# Patient Record
Sex: Female | Born: 1940 | Race: White | Hispanic: No | Smoking: Former smoker
Health system: Southern US, Community
[De-identification: ages and names within clinical notes are randomized; demographics above are authoritative.]

## PROBLEM LIST (undated history)

## (undated) DIAGNOSIS — C801 Malignant (primary) neoplasm, unspecified: Secondary | ICD-10-CM

## (undated) DIAGNOSIS — Z86010 Personal history of colonic polyps: Secondary | ICD-10-CM

## (undated) DIAGNOSIS — Z860101 Personal history of adenomatous and serrated colon polyps: Secondary | ICD-10-CM

## (undated) DIAGNOSIS — I10 Essential (primary) hypertension: Secondary | ICD-10-CM

## (undated) DIAGNOSIS — R519 Headache, unspecified: Secondary | ICD-10-CM

## (undated) DIAGNOSIS — E7800 Pure hypercholesterolemia, unspecified: Secondary | ICD-10-CM

## (undated) DIAGNOSIS — J302 Other seasonal allergic rhinitis: Secondary | ICD-10-CM

## (undated) DIAGNOSIS — E119 Type 2 diabetes mellitus without complications: Secondary | ICD-10-CM

## (undated) DIAGNOSIS — K219 Gastro-esophageal reflux disease without esophagitis: Secondary | ICD-10-CM

## (undated) DIAGNOSIS — M109 Gout, unspecified: Secondary | ICD-10-CM

## (undated) HISTORY — PX: ABDOMINAL HYSTERECTOMY: SHX81

## (undated) HISTORY — DX: Gout, unspecified: M10.9

## (undated) HISTORY — DX: Personal history of adenomatous and serrated colon polyps: Z86.0101

## (undated) HISTORY — PX: PARATHYROIDECTOMY: SHX19

## (undated) HISTORY — DX: Essential (primary) hypertension: I10

## (undated) HISTORY — DX: Personal history of colonic polyps: Z86.010

## (undated) HISTORY — DX: Type 2 diabetes mellitus without complications: E11.9

---

## 2000-11-06 ENCOUNTER — Ambulatory Visit (HOSPITAL_COMMUNITY): Admission: RE | Admit: 2000-11-06 | Discharge: 2000-11-06 | Payer: Self-pay | Admitting: Internal Medicine

## 2007-10-14 ENCOUNTER — Ambulatory Visit: Payer: Self-pay | Admitting: Gastroenterology

## 2007-10-25 ENCOUNTER — Ambulatory Visit (HOSPITAL_COMMUNITY): Admission: RE | Admit: 2007-10-25 | Discharge: 2007-10-25 | Payer: Self-pay | Admitting: Internal Medicine

## 2007-10-25 ENCOUNTER — Encounter: Payer: Self-pay | Admitting: Internal Medicine

## 2007-10-25 ENCOUNTER — Ambulatory Visit: Payer: Self-pay | Admitting: Internal Medicine

## 2007-10-25 HISTORY — PX: COLONOSCOPY: SHX174

## 2007-10-25 HISTORY — PX: ESOPHAGOGASTRODUODENOSCOPY: SHX1529

## 2007-12-03 ENCOUNTER — Ambulatory Visit: Payer: Self-pay | Admitting: Internal Medicine

## 2008-01-20 ENCOUNTER — Ambulatory Visit: Payer: Self-pay | Admitting: Internal Medicine

## 2009-02-12 ENCOUNTER — Encounter: Payer: Self-pay | Admitting: Urgent Care

## 2009-03-09 ENCOUNTER — Ambulatory Visit: Payer: Self-pay | Admitting: Internal Medicine

## 2009-03-09 DIAGNOSIS — K219 Gastro-esophageal reflux disease without esophagitis: Secondary | ICD-10-CM

## 2009-03-09 DIAGNOSIS — K5730 Diverticulosis of large intestine without perforation or abscess without bleeding: Secondary | ICD-10-CM | POA: Insufficient documentation

## 2009-03-09 DIAGNOSIS — K449 Diaphragmatic hernia without obstruction or gangrene: Secondary | ICD-10-CM | POA: Insufficient documentation

## 2009-03-09 DIAGNOSIS — J301 Allergic rhinitis due to pollen: Secondary | ICD-10-CM

## 2009-03-09 DIAGNOSIS — Z86010 Personal history of colonic polyps: Secondary | ICD-10-CM | POA: Insufficient documentation

## 2009-04-03 ENCOUNTER — Encounter: Payer: Self-pay | Admitting: Gastroenterology

## 2009-09-25 ENCOUNTER — Encounter: Payer: Self-pay | Admitting: Urgent Care

## 2009-09-26 ENCOUNTER — Encounter: Payer: Self-pay | Admitting: Internal Medicine

## 2009-09-27 ENCOUNTER — Encounter: Payer: Self-pay | Admitting: Gastroenterology

## 2010-03-19 ENCOUNTER — Encounter (INDEPENDENT_AMBULATORY_CARE_PROVIDER_SITE_OTHER): Payer: Self-pay | Admitting: *Deleted

## 2010-05-07 NOTE — Medication Information (Signed)
Summary: Tax adviser   Imported By: Rosine Beat 09/26/2009 09:49:30  _____________________________________________________________________  External Attachment:    Type:   Image     Comment:   External Document

## 2010-05-07 NOTE — Medication Information (Signed)
Summary: Tax adviser   Imported By: Diana Eves 09/27/2009 11:44:33  _____________________________________________________________________  External Attachment:    Type:   Image     Comment:   External Document  Appended Document: RX Folder KJ did this two days ago.  Appended Document: RX Folder pharmacy aware. they did already have it.

## 2010-05-07 NOTE — Medication Information (Signed)
Summary: RX Folder  RX Folder   Imported By: Peggyann Shoals 09/25/2009 10:53:18  _____________________________________________________________________  External Attachment:    Type:   Image     Comment:   External Document  Appended Document: RX FolderOMEPRAZOLE    Prescriptions: OMEPRAZOLE 20 MG CPDR (OMEPRAZOLE) 1 by mouth daily for acid reflux  #31 x 11   Entered and Authorized by:   Joselyn Arrow FNP-BC   Signed by:   Joselyn Arrow FNP-BC on 09/25/2009   Method used:   Electronically to        [facility]* (retail)       [address]       [place], [place]  [address]       Ph: [phone]       Fax: [phone]   RxID:   [id number]

## 2010-05-09 NOTE — Letter (Signed)
Summary: Recall Office Visit  [facility]  [address]   [place], [place] [address]   Phone: [phone]  Fax: [phone]      March 19, 2010   Michele Pace [address] [place], [place]  [address] 02-May-1940   Dear Ms. Gautier,   According to our records, it is time for you to schedule a follow-up office visit with [place].   At your convenience, please call [phone] to schedule an office visit. If you have any questions, concerns, or feel that this letter is in error, we would appreciate your call.   Sincerely,    Diana Eves  [facility] Ph: [phone]   Fax: [phone]

## 2010-08-20 NOTE — Assessment & Plan Note (Signed)
Michele Pace, Michele Pace                    CHART#:  [id number]   DATE:  01/20/2008                       DOB:  02/24/1941   The patient was last seen here on December 03, 2007, with history of  gastroesophageal reflux, diverticulosis, prior history of colonic  polyps, and gas bloat symptoms.  She failed to improve significantly on  Prevacid and omeprazole.  We started on some Aciphex 20 mg orally daily.  She is also started on Digestive Advantage for gas bloat 1 capsule  daily.  She is here stating she 100% better and her quality of life is  much better, gas is diminished and reflux symptoms well are  controlled  on Aciphex 20 mg orally daily.  She is very happy wondering if she  should take a couple of FiberChoice tablets daily.   CURRENT MEDICATIONS:  See updated list.   ALLERGIES:  Codeine.   PHYSICAL EXAMINATION:  GENERAL:  Today, she looks pretty well.  VITAL SIGNS:  Weight 176, height 5 feet 5 inches, temperature 99, BP  110/82, and pulse 76.  SKIN:  Warm and dry.  ABDOMEN:  Obese, positive bowel sounds, soft, entirely nontender.  No  appreciable mass or organomegaly.   ASSESSMENT:  Gastroesophageal reflux disease symptoms well controlled on  Aciphex, gas bloat symptoms well managed on Digestive Advantage, history  of diverticulosis, and prior history of Colonic polyps.   RECOMMENDATIONS:  1. Continue Aciphex 20 mg orally daily.  Continue Digestive Advantage      one capsule daily.  2. She was told it would be fine to take a fiber supplement daily as      well.  Unless something comes up, I will plan to see this nice lady      back in 1 year for a recheck.  She will be slated for surveillance      colonoscopy in 5 years.       Jonathon Bellows, M.D.  Electronically Signed    RMR/MEDQ  D:  01/20/2008  T:  01/21/2008  Job:  [id number]   cc:   Dr. Sherryll Burger

## 2010-08-20 NOTE — Op Note (Signed)
NAME:  Michele Pace, Michele Pace NO.:  [id number]   MEDICAL RECORD NO.:  [id number]          PATIENT TYPE:  AMB   LOCATION:  DAY                           FACILITY:  [facility]   PHYSICIAN:  R. Roetta Sessions, M.D. DATE OF BIRTH:  February 14, 1941   DATE OF PROCEDURE:  DATE OF DISCHARGE:                               OPERATIVE REPORT   EGD WITH BIOPSY FOLLOWED BY COLONOSCOPY   INDICATIONS FOR PROCEDURE:  A 70 year old lady with a history of colonic  polyps.  Last colonoscopy some 7 years ago.  He has had apparent  refractory chronic gastroesophageal reflux to Prevacid 30 mg orally  daily.  Dr. Herbie Drape recommended she bump up to her Prevacid to b.i.d., but  she has not yet done so.  We saw her in the office on October 14, 2007.  EGD  and colonoscopy now being done.  This approach has been discussed with  the patient at length.  Potential risks, benefits, and alternatives have  been reviewed, questions answered.  Please see the documentation in the  medical record.   PROCEDURE NOTE:  O2 saturation, blood pressure, pulse, and respirations  monitored throughout the entire procedure.   CONSCIOUS SEDATION:  Versed 6 mg IV and Demerol 75 mg IV in divided  doses.  Cetacaine spray for topical pharyngeal anesthesia.   INSTRUMENT:  Pentax video chip system.   FINDINGS:  EGD examination of the tubular esophagus revealed patulous EG  junction, otherwise esophageal mucosa appeared normal.  EG junction  easily traversed.   STOMACH:  Gas cavity was emptied, insufflated well with air.  Thorough  examination of the gastric mucosa including retroflexed view of the  proximal stomach esophagogastric junction demonstrated only a small  hiatal hernia and some focal antral prepyloric erosions.  There was no  ulcer infiltrating process.  Pylorus was patent, easily traversed.  Examination of the bulb revealed bulbar erosions, otherwise D1 and D2  appeared normal.   THERAPEUTIC/DIAGNOSTIC MANEUVERS PERFORMED:   Biopsies of the antral  mucosa were taken for histologic study.  The patient tolerated the  procedure well and was prepared for colonoscopy.  Digital rectal exam  revealed no abnormalities.   ENDOSCOPIC FINDINGS:  The prep was adequate.   COLON:  Colonic mucosa was surveyed from the rectosigmoid junction  through the left transverse, right colon, to the appendiceal orifice,  ileocecal valve, and cecum.  These structures were well seen,  photographed for the record.  From this level, scope was slowly  withdrawn.  All previous mucosal surfaces were again seen.  The patient  had left-sided diverticula.  The remainder of colonic mucosa appeared  normal.  Scope was pulled down the record.  A thorough examination of  the rectal mucosa including retroflexion of the anal verge demonstrated  no abnormalities.  The patient tolerated both procedures well, was  reactive to endoscopy.   IMPRESSION:  Patulous esophagogastric junction focal antral erosions,  otherwise normal-appearing gastric mucosa, patent pylorus, bulbar  erosions, otherwise D1 and D2 appeared normal.   COLONOSCOPY FINDINGS:  Normal rectum, left-sided diverticula, and  colonic  mucosa appeared normal.   RECOMMENDATIONS:  1. Gastroesophageal flux disease/diverticulosis.  Literature provided      to Ms. Gearldine Bienenstock.  Agreed with Dr. Rico Ala plan to go and increase      Prevacid to 30 mg orally twice daily for the next month.  2. Plan for repeat colonoscopy in 5 years.  3. Follow up with [place] in 1 month.      Jonathon Bellows, M.D.  Electronically Signed     RMR/MEDQ  D:  10/25/2007  T:  10/26/2007  Job:  [id number]   cc:   Dr. Herbie Drape

## 2010-08-20 NOTE — Assessment & Plan Note (Signed)
NAMEBABITA, AMAKER                    CHART#:  [id number]   DATE:  12/03/2007                       DOB:  08-21-1940   Last seen on 10/25/2007 at which time she underwent both an EGD and a  colonoscopy for refractory gastroesophageal reflux disease, positive  history of polyps.  EGD demonstrated some focal antral erosions,  patulous EG junction with some bulbar erosions as well.  Colonoscopy  demonstrated left-sided diverticula.  Remainder of the rectum and colon  appeared normal.  We had been under the assumption she was on Prevacid  30 mg orally daily, which is bumped up to b.i.d. on 10/25/2007, but she  actually had been on omeprazole 20 mg orally daily, and this was bumped  up.  This has only been of minimal success with ameliorating her  symptoms.  She does have quite a bit of postprandial abdominal bloating  and really does not have any frank abdominal pain.  She denies  constipation, diarrhea, melena, or hematochezia.  She does take Beano on  occasion.   CURRENT MEDICATIONS:  See updated list.   ALLERGIES:  Codeine.   PHYSICAL EXAMINATION:  GENERAL:  She appears her baseline.  VITAL SIGNS:  Weight 178, height 5 foot 5-1/2 inches, temperature 98, BP  120/84, pulse 70.  SKIN:  Warm and dry.  ABDOMEN:  Flat, positive bowel sounds, soft, entirely nontender to deep  palpation.  No appreciable mass or organomegaly.   ASSESSMENT:  1. History of antral and bulbar erosions on recent      esophagogastroduodenoscopy, patulous esophagogastric junction,      suspect a significant element of gastroesophageal reflux disease.  2. History of diverticulosis, otherwise negative colonoscopy, recent      history of polyps.  She has significant gas bloat symptoms.   RECOMMENDATIONS:  1. Stop omeprazole for time being.  Begin Aciphex 20 mg orally daily      30 minutes before breakfast.  Samples for 1 month given.  2. Begin Digestive Advantage for gas bloat 1 capsule daily.  Samples      given  for the next      month.  We will how she does over the next 4 weeks.  I plan to see      her back in 1 month to assess her progress.       Jonathon Bellows, M.D.  Electronically Signed     RMR/MEDQ  D:  12/03/2007  T:  12/04/2007  Job:  [id number]   cc:   Kirstie Peri, MD

## 2010-08-20 NOTE — Consult Note (Signed)
NAMECLARINE, ELROD NO.:  [id number]   MEDICAL RECORD NO.:  [id number]          PATIENT TYPE:  AMB   LOCATION:  DAY                           FACILITY:  [facility]   PHYSICIAN:  Kassie Mends, M.D.      DATE OF BIRTH:  05-15-1940   DATE OF CONSULTATION:  10/14/2007  DATE OF DISCHARGE:                                 CONSULTATION   REQUESTING PHYSICIAN:  Kirstie Peri, MD   PRIMARY GASTROENTEROLOGIST:  Jonathon Bellows, MD   REASON FOR CONSULTATION:  Due for colonoscopy, history of polyps, and  refractory GERD.   HISTORY OF PRESENT ILLNESS:  Ms. Delahoz is a 69 year old Caucasian female.  She returns as she tells me it is time to have another colonoscopy.  Upon further review of her records, she did have a colonoscopy with  snare polypectomy on November 06, 2000, by Dr. Jena Gauss.  She was found to  have internal hemorrhoids, a polyp on a stalk at 35 cm, which was  cauterized and on biopsy, it was identifiable only as colonic mucosa.  Therefore, there was no determination as to whether this was adenomatous  or hyperplastic.  She does have a family history of a paternal aunt with  colon cancer and therefore has been recommended by Dr. Jena Gauss that she  pursue a colonoscopy in 5 years, she is 2 years overdue.  She has a  history of IV as she occasionally has loose stools.  Denies any rectal  bleeding or melena.  Denies any abdominal pain except for intermittent  heartburn.  She is having breakthrough symptoms a couple of times per  week.  She has been on omeprazole 20 mg daily and was just recently  changed to Prevacid 30 mg b.i.d.  Her weight is steadily increasing.  She denies any anorexia.  She has had chronic GERD for over 10 years.  Denies any dysphagia, odynophagia, nausea, or vomiting.   PAST MEDICAL AND SURGICAL HISTORY:  1. Hypertension.  2. Chronic GERD.  3. Seasonal allergies.  4. Colonic polyps.  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   CURRENT MEDICATIONS:  1. Omeprazole 20 mg daily, will be changed tomorrow to Prevacid 30 mg      b.i.d.  2. Lovaza 1 g daily.  3. Amlodipine 5/20 mg daily.  4. Estradiol 1 mg q.3 days.  5. Chlorthalidone 12.5 mg daily.  6. Zyrtec 10 mg daily.  7. Vitamin D 1000 international units daily.  8. Vitamin D3 1000 international units daily.  9. Aspirin 325 mg p.r.n.  10.Gas-X p.r.n.   ALLERGIES:  CODEINE.   FAMILY HISTORY:  Positive for paternal aunt with colon cancer.  No first-  degree relatives.  Mother deceased at 29, secondary to osteoarthritis.  Father deceased at 14, secondary to CVA.  She has a sister with possible  degenerative problems and then both siblings have a heart valve  problem/murmurs.   SOCIAL HISTORY:  Ms. Hugh is a widow.  She is a Environmental manager.  She smokes about 3 packs of cigarettes a week and has for the last 25  years.  She rarely consumes alcoholic beverages.  Denies any drug use.   REVIEW OF SYSTEMS:  See HPI, otherwise negative.   PHYSICAL EXAMINATION:  VITAL SIGNS:  Weight 172 pounds, height 65-1/2  inches, temperature 97.7, blood pressure 100/80, and pulse 80.  GENERAL:  Ms. Spadafora is a 70 year old, well-developed, well-nourished  Caucasian female, in no acute distress.  HEENT:  Sclerae clear, nonicteric.  Conjunctivae pink.  Oropharynx pink  and moist without any lesions.  NECK:  Supple without mass or thyromegaly.  CHEST:  Heart, regular rate and rhythm.  Normal S1 and S2.  No murmurs,  clicks, rubs, or gallops.  LUNGS:  Clear auscultation bilaterally.  ABDOMEN:  Positive bowel sounds x4.  No bruits auscultated.  Soft,  nontender, and nondistended without palpable mass or hepatosplenomegaly.  No rebound tenderness or guarding.  EXTREMITIES:  Without clubbing or edema.  SKIN:  Pink, warm, and dry without any rash or jaundice.   IMPRESSION:  Ms. Contino is a 70 year old Caucasian  female, who is due for  colonoscopy given history of colonic polyps.  Last colonoscopy polyp was  unable to be identified.  It was on a stalk and therefore, she is due  for surveillance at this time.   She has a history of chronic gastroesophageal reflux disease with  breakthrough heartburn, on proton pump inhibitor.  She is going to  require further evaluation to rule out complicated gastroesophageal  reflux disease including erosive reflux esophagitis and/or Barrett  esophagus.   PLAN:  Colonoscopy and EGD with Dr. Jena Gauss in the near future.  I  discussed the procedure including risks and benefits to include, but not  limited to bleeding, infection, perforation, or drug reaction.  She  agrees with the plan and consent will be obtained.   Thank you Dr. Sherryll Burger for allowing [place] to participate in the care of Ms.  Lacaze.      Lorenza Burton, N.P.      Kassie Mends, M.D.  Electronically Signed    KJ/MEDQ  D:  10/14/2007  T:  10/15/2007  Job:  [id number]   cc:   Kirstie Peri, MD  Fax: [phone]

## 2012-01-14 ENCOUNTER — Telehealth: Payer: Self-pay

## 2012-01-14 NOTE — Telephone Encounter (Signed)
Returned her call and LMOM to call. ( She had an EGD/TCS in 10/2007 and Dr. Jena Gauss recommended her next one in 5 years which would be 10/2012. She has a history of colonic polyps.

## 2012-01-14 NOTE — Telephone Encounter (Signed)
Pt returned call. Said she is not having any problems. Dr. Sherryll Burger had thought her last one was in 2002. I reminded her her last one was in 10/2007 and Dr. Jena Gauss recommended her next one should be in 10/2012. She will call if she has problems before then. I am sending Dr. Sherryll Burger the info.

## 2014-03-08 ENCOUNTER — Ambulatory Visit: Payer: Self-pay | Admitting: Gastroenterology

## 2014-03-09 ENCOUNTER — Ambulatory Visit: Payer: Self-pay | Admitting: Gastroenterology

## 2014-03-29 ENCOUNTER — Ambulatory Visit (INDEPENDENT_AMBULATORY_CARE_PROVIDER_SITE_OTHER): Payer: Medicare Other | Admitting: Gastroenterology

## 2014-03-29 ENCOUNTER — Encounter: Payer: Self-pay | Admitting: Gastroenterology

## 2014-03-29 ENCOUNTER — Other Ambulatory Visit: Payer: Self-pay

## 2014-03-29 VITALS — BP 138/81 | HR 85 | Temp 96.5°F | Ht 63.0 in | Wt 154.6 lb

## 2014-03-29 DIAGNOSIS — Z86010 Personal history of colonic polyps: Secondary | ICD-10-CM

## 2014-03-29 MED ORDER — PEG 3350-KCL-NA BICARB-NACL 420 G PO SOLR: 4000.0000 mL | ORAL | Status: DC

## 2014-03-29 NOTE — Progress Notes (Signed)
Primary Care Physician:  Monico Blitz, MD  Primary Gastroenterologist:  Garfield Cornea, MD   Chief Complaint  Patient presents with  . Advice Only    TCS    HPI:  Michele Pace is a 73 y.o. female here to schedule surveillance colonoscopy. She has a history of colonic polyps previously. Her last colonoscopy was in July 2009 at which time she had left-sided diverticula only. She was advised to 5 years.  Diagnosed one year ago with DM. Last HgbA1C 5.8. Lost 35 pounds and exercise 4-5 times per week. 3 months ago developed gout so exercise somewhat limited currently. Does well as long as takes omeprazole. Rare bloating. No heartburn, dysphagia, vomiting. Bowel movement once daily. No melena rectal bleeding. Denies abdominal pain.  Current Outpatient Prescriptions  Medication Sig Dispense Refill  . allopurinol (ZYLOPRIM) 100 MG tablet Take 100 mg by mouth.    Marland Kitchen amLODipine-benazepril (LOTREL) 5-20 MG per capsule Take 1 capsule by mouth.    Marland Kitchen aspirin 81 MG tablet Take 81 mg by mouth daily.    Marland Kitchen b complex vitamins tablet Take 1 tablet by mouth daily.    . calcium citrate (CALCITRATE - DOSED IN MG ELEMENTAL CALCIUM) 950 MG tablet Take 600 mg by mouth daily.    . cholecalciferol (VITAMIN D) 1000 UNITS tablet Take 1,000 Units by mouth daily.    . cyanocobalamin 1000 MCG tablet Take 500 mcg by mouth daily.    Marland Kitchen loratadine (CLARITIN) 10 MG tablet Take 10 mg by mouth daily.    . Misc Natural Products (TART CHERRY ADVANCED) CAPS Take 465 capsules by mouth once.    . multivitamin-iron-minerals-folic acid (CENTRUM) chewable tablet Chew 1 tablet by mouth daily.    Marland Kitchen omega-3 acid ethyl esters (LOVAZA) 1 G capsule Take 1 g by mouth daily.    Marland Kitchen omeprazole (PRILOSEC) 20 MG capsule Take 20 mg by mouth daily.    . pravastatin (PRAVACHOL) 20 MG tablet Take 20 mg by mouth daily.    . vitamin C (ASCORBIC ACID) 500 MG tablet Take 500 mg by mouth daily.     No current facility-administered medications for this  visit.    Allergies as of 03/29/2014 - Review Complete 03/29/2014  Allergen Reaction Noted  . Codeine      Past Medical History  Diagnosis Date  . Gout   . Diabetes     diet controlled  . HTN (hypertension)   . Hx of adenomatous colonic polyps     Past Surgical History  Procedure Laterality Date  . Esophagogastroduodenoscopy  10/25/07    XYI:[id number] esophagogasteic junction focal antral erosions and bular erosions, otherwise normal. mild chronic gastritis.   . Colonoscopy  10/25/07    RMR: normal rectum left sided diverticula  . Abdominal hysterectomy    . Parathyroidectomy  1999/2000    removed two    Family History  Problem Relation Age of Onset  . Colon cancer Paternal Aunt   . Heart disease      History   Social History  . Marital Status: Widowed    Spouse Name: N/A    Number of Children: N/A  . Years of Education: N/A   Occupational History  . retired Pharmacist, hospital    Social History Main Topics  . Smoking status: Former Research scientist (life sciences)  . Smokeless tobacco: Former Systems developer    Quit date: 09/05/2008  . Alcohol Use: No  . Drug Use: No  . Sexual Activity: Not on file   Other Topics Concern  .  Not on file   Social History Narrative   Adopted daughter      ROS:  General: Negative for anorexia, weight loss, fever, chills, fatigue, weakness. Eyes: Negative for vision changes.  ENT: Negative for hoarseness, difficulty swallowing , nasal congestion. CV: Negative for chest pain, angina, palpitations, dyspnea on exertion, peripheral edema.  Respiratory: Negative for dyspnea at rest, dyspnea on exertion, cough, sputum, wheezing.  GI: See history of present illness. GU:  Negative for dysuria, hematuria, urinary incontinence, urinary frequency, nocturnal urination.  MS: Negative for joint pain, low back pain.  Derm: Negative for rash or itching.  Neuro: Negative for weakness, abnormal sensation, seizure, frequent headaches, memory loss, confusion.  Psych: Negative for  anxiety, depression, suicidal ideation, hallucinations.  Endo: Negative for unusual weight change.  Heme: Negative for bruising or bleeding. Allergy: Negative for rash or hives.    Physical Examination:  BP 138/81 mmHg  Pulse 85  Temp(Src) 96.5 F (35.8 C)  Ht 5\' 3"  (1.6 m)  Wt 154 lb 9.6 oz (70.126 kg)  BMI 27.39 kg/m2   General: Well-nourished, well-developed in no acute distress.  Head: Normocephalic, atraumatic.   Eyes: Conjunctiva pink, no icterus. Mouth: Oropharyngeal mucosa moist and pink , no lesions erythema or exudate. Neck: Supple without thyromegaly, masses, or lymphadenopathy.  Lungs: Clear to auscultation bilaterally.  Heart: Regular rate and rhythm, no murmurs rubs or gallops.  Abdomen: Bowel sounds are normal, nontender, nondistended, no hepatosplenomegaly or masses, no abdominal bruits or    hernia , no rebound or guarding.   Rectal: Not performed Extremities: No lower extremity edema. No clubbing or deformities.  Neuro: Alert and oriented x 4 , grossly normal neurologically.  Skin: Warm and dry, no rash or jaundice.   Psych: Alert and cooperative, normal mood and affect.

## 2014-03-29 NOTE — Assessment & Plan Note (Signed)
73 year old female with history of colonic polyps is due for her surveillance colonoscopy. Clinically doing well. Colonoscopy in the near future.  I have discussed the risks, alternatives, benefits with regards to but not limited to the risk of reaction to medication, bleeding, infection, perforation and the patient is agreeable to proceed. Written consent to be obtained.

## 2014-03-29 NOTE — Patient Instructions (Signed)
Colonoscopy as scheduled.  Please see separate instructions. 

## 2014-04-04 NOTE — Progress Notes (Signed)
cc'ed to pcp °

## 2014-04-26 ENCOUNTER — Encounter (HOSPITAL_COMMUNITY): Payer: Self-pay

## 2014-04-26 ENCOUNTER — Ambulatory Visit (HOSPITAL_COMMUNITY)
Admission: RE | Admit: 2014-04-26 | Discharge: 2014-04-26 | Disposition: A | Discharge status: Home / Self Care | Payer: Medicare Other | Source: Ambulatory Visit | Attending: Internal Medicine | Admitting: Internal Medicine

## 2014-04-26 ENCOUNTER — Encounter (HOSPITAL_COMMUNITY)
Admission: RE | Disposition: A | Discharge status: Home / Self Care | Payer: Self-pay | Source: Ambulatory Visit | Attending: Internal Medicine

## 2014-04-26 DIAGNOSIS — Z800 Family history of malignant neoplasm of digestive organs: Secondary | ICD-10-CM | POA: Diagnosis not present

## 2014-04-26 DIAGNOSIS — Z86010 Personal history of colonic polyps: Secondary | ICD-10-CM

## 2014-04-26 DIAGNOSIS — D124 Benign neoplasm of descending colon: Secondary | ICD-10-CM

## 2014-04-26 DIAGNOSIS — M109 Gout, unspecified: Secondary | ICD-10-CM | POA: Insufficient documentation

## 2014-04-26 DIAGNOSIS — K5730 Diverticulosis of large intestine without perforation or abscess without bleeding: Secondary | ICD-10-CM | POA: Diagnosis not present

## 2014-04-26 DIAGNOSIS — Z1211 Encounter for screening for malignant neoplasm of colon: Secondary | ICD-10-CM | POA: Insufficient documentation

## 2014-04-26 DIAGNOSIS — I10 Essential (primary) hypertension: Secondary | ICD-10-CM | POA: Diagnosis not present

## 2014-04-26 DIAGNOSIS — Z7982 Long term (current) use of aspirin: Secondary | ICD-10-CM | POA: Insufficient documentation

## 2014-04-26 DIAGNOSIS — Z87891 Personal history of nicotine dependence: Secondary | ICD-10-CM | POA: Diagnosis not present

## 2014-04-26 DIAGNOSIS — E119 Type 2 diabetes mellitus without complications: Secondary | ICD-10-CM | POA: Diagnosis not present

## 2014-04-26 DIAGNOSIS — Z885 Allergy status to narcotic agent status: Secondary | ICD-10-CM | POA: Insufficient documentation

## 2014-04-26 HISTORY — DX: Pure hypercholesterolemia, unspecified: E78.00

## 2014-04-26 HISTORY — DX: Gastro-esophageal reflux disease without esophagitis: K21.9

## 2014-04-26 HISTORY — PX: COLONOSCOPY: SHX5424

## 2014-04-26 LAB — GLUCOSE, CAPILLARY: GLUCOSE-CAPILLARY: 90 mg/dL (ref 70–99)

## 2014-04-26 SURGERY — COLONOSCOPY
Anesthesia: Moderate Sedation

## 2014-04-26 MED ORDER — SODIUM CHLORIDE 0.9 % IV SOLN
INTRAVENOUS | Status: DC
  Administered 2014-04-26: 07:00:00 via INTRAVENOUS

## 2014-04-26 MED ORDER — ONDANSETRON HCL 4 MG/2ML IJ SOLN
INTRAMUSCULAR | Status: DC | PRN
  Administered 2014-04-26: 4 mg via INTRAVENOUS

## 2014-04-26 MED ORDER — ONDANSETRON HCL 4 MG/2ML IJ SOLN
INTRAMUSCULAR | Status: AC
  Filled 2014-04-26: qty 2

## 2014-04-26 MED ORDER — MIDAZOLAM HCL 5 MG/5ML IJ SOLN
INTRAMUSCULAR | Status: AC
  Filled 2014-04-26: qty 10

## 2014-04-26 MED ORDER — MEPERIDINE HCL 100 MG/ML IJ SOLN
INTRAMUSCULAR | Status: AC
  Filled 2014-04-26: qty 2

## 2014-04-26 MED ORDER — STERILE WATER FOR IRRIGATION IR SOLN
Status: DC | PRN
  Administered 2014-04-26: 08:00:00

## 2014-04-26 MED ORDER — MIDAZOLAM HCL 5 MG/5ML IJ SOLN
INTRAMUSCULAR | Status: DC | PRN
  Administered 2014-04-26: 2 mg via INTRAVENOUS
  Administered 2014-04-26 (×2): 1 mg via INTRAVENOUS

## 2014-04-26 MED ORDER — MEPERIDINE HCL 100 MG/ML IJ SOLN
INTRAMUSCULAR | Status: DC | PRN
  Administered 2014-04-26: 50 mg via INTRAVENOUS
  Administered 2014-04-26: 25 mg via INTRAVENOUS

## 2014-04-26 NOTE — Op Note (Signed)
[facility] [address] [place], [address]   COLONOSCOPY PROCEDURE REPORT  PATIENT: Michele, Pace  MR#: [id number] BIRTHDATE: Nov 10, 1940 , 73  yrs. old GENDER: female ENDOSCOPIST: R.  Garfield Cornea, MD FACP [facility] REFERRED OF:BPZWCH Manuella Ghazi, M.D. PROCEDURE DATE:  2014-05-15 PROCEDURE:   Colonoscopy with biopsy INDICATIONS:Surveillance examination; history of colonic adenoma. MEDICATIONS: Versed 4 mg IV and Demerol 75 mg IV in divided doses. Zofran 4 mg IV. ASA CLASS:       Class II  CONSENT: The risks, benefits, alternatives and imponderables including but not limited to bleeding, perforation as well as the possibility of a missed lesion have been reviewed.  The potential for biopsy, lesion removal, etc. have also been discussed. Questions have been answered.  All parties agreeable.  Please see the history and physical in the medical record for more information.  DESCRIPTION OF PROCEDURE:   After the risks benefits and alternatives of the procedure were thoroughly explained, informed consent was obtained.  The digital rectal exam revealed no abnormalities of the rectum.   The EC-[id number] ([id number])  endoscope was introduced through the anus and advanced to the cecum, which was identified by both the appendix and ileocecal valve. No adverse events experienced.   The quality of the prep was adequate.  The instrument was then slowly withdrawn as the colon was fully examined.      COLON FINDINGS: Normal rectum.  Scattered sigmoid diverticula; (1) 3 mm diminutive polyp in the mid descending segment; otherwise, the remainder of the colonic mucosa.  Normal.  The above-mentioned polyp was removed completely with 2 bites with a jumbo biopsy forceps.  Retroflexed views revealed no abnormalities. .  Withdrawal time=16 minutes 0 seconds.  The scope was withdrawn and the procedure completed. COMPLICATIONS: There were no immediate complications.  ENDOSCOPIC  IMPRESSION: Colonic diverticulosis. Single colonic polyp?"removed as described above.  RECOMMENDATIONS: Follow-up on pathology.  eSigned:  R. Garfield Cornea, MD Rosalita Chessman [facility] 05/15/2014 8:32 AM   cc:  CPT CODES: ICD CODES:  The ICD and CPT codes recommended by this software are interpretations from the data that the clinical staff has captured with the software.  The verification of the translation of this report to the ICD and CPT codes and modifiers is the sole responsibility of the health care institution and practicing physician where this report was generated.  [employer] will not be held responsible for the validity of the ICD and CPT codes included on this report.  AMA assumes no liability for data contained or not contained herein. CPT is a Designer, television/film set of the [employer].  PATIENT NAME:  Michele, Pace MR#: [id number]

## 2014-04-26 NOTE — H&P (View-Only) (Signed)
Primary Care Physician:  Monico Blitz, MD  Primary Gastroenterologist:  Garfield Cornea, MD   Chief Complaint  Patient presents with  . Advice Only    TCS    HPI:  Michele Pace is a 74 y.o. female here to schedule surveillance colonoscopy. She has a history of colonic polyps previously. Her last colonoscopy was in July 2009 at which time she had left-sided diverticula only. She was advised to 5 years.  Diagnosed one year ago with DM. Last HgbA1C 5.8. Lost 35 pounds and exercise 4-5 times per week. 3 months ago developed gout so exercise somewhat limited currently. Does well as long as takes omeprazole. Rare bloating. No heartburn, dysphagia, vomiting. Bowel movement once daily. No melena rectal bleeding. Denies abdominal pain.  Current Outpatient Prescriptions  Medication Sig Dispense Refill  . allopurinol (ZYLOPRIM) 100 MG tablet Take 100 mg by mouth.    Marland Kitchen amLODipine-benazepril (LOTREL) 5-20 MG per capsule Take 1 capsule by mouth.    Marland Kitchen aspirin 81 MG tablet Take 81 mg by mouth daily.    Marland Kitchen b complex vitamins tablet Take 1 tablet by mouth daily.    . calcium citrate (CALCITRATE - DOSED IN MG ELEMENTAL CALCIUM) 950 MG tablet Take 600 mg by mouth daily.    . cholecalciferol (VITAMIN D) 1000 UNITS tablet Take 1,000 Units by mouth daily.    . cyanocobalamin 1000 MCG tablet Take 500 mcg by mouth daily.    Marland Kitchen loratadine (CLARITIN) 10 MG tablet Take 10 mg by mouth daily.    . Misc Natural Products (TART CHERRY ADVANCED) CAPS Take 465 capsules by mouth once.    . multivitamin-iron-minerals-folic acid (CENTRUM) chewable tablet Chew 1 tablet by mouth daily.    Marland Kitchen omega-3 acid ethyl esters (LOVAZA) 1 G capsule Take 1 g by mouth daily.    Marland Kitchen omeprazole (PRILOSEC) 20 MG capsule Take 20 mg by mouth daily.    . pravastatin (PRAVACHOL) 20 MG tablet Take 20 mg by mouth daily.    . vitamin C (ASCORBIC ACID) 500 MG tablet Take 500 mg by mouth daily.     No current facility-administered medications for this  visit.    Allergies as of 03/29/2014 - Review Complete 03/29/2014  Allergen Reaction Noted  . Codeine      Past Medical History  Diagnosis Date  . Gout   . Diabetes     diet controlled  . HTN (hypertension)   . Hx of adenomatous colonic polyps     Past Surgical History  Procedure Laterality Date  . Esophagogastroduodenoscopy  10/25/07    GEX:[id number] esophagogasteic junction focal antral erosions and bular erosions, otherwise normal. mild chronic gastritis.   . Colonoscopy  10/25/07    RMR: normal rectum left sided diverticula  . Abdominal hysterectomy    . Parathyroidectomy  1999/2000    removed two    Family History  Problem Relation Age of Onset  . Colon cancer Paternal Aunt   . Heart disease      History   Social History  . Marital Status: Widowed    Spouse Name: N/A    Number of Children: N/A  . Years of Education: N/A   Occupational History  . retired Pharmacist, hospital    Social History Main Topics  . Smoking status: Former Research scientist (life sciences)  . Smokeless tobacco: Former Systems developer    Quit date: 09/05/2008  . Alcohol Use: No  . Drug Use: No  . Sexual Activity: Not on file   Other Topics Concern  .  Not on file   Social History Narrative   Adopted daughter      ROS:  General: Negative for anorexia, weight loss, fever, chills, fatigue, weakness. Eyes: Negative for vision changes.  ENT: Negative for hoarseness, difficulty swallowing , nasal congestion. CV: Negative for chest pain, angina, palpitations, dyspnea on exertion, peripheral edema.  Respiratory: Negative for dyspnea at rest, dyspnea on exertion, cough, sputum, wheezing.  GI: See history of present illness. GU:  Negative for dysuria, hematuria, urinary incontinence, urinary frequency, nocturnal urination.  MS: Negative for joint pain, low back pain.  Derm: Negative for rash or itching.  Neuro: Negative for weakness, abnormal sensation, seizure, frequent headaches, memory loss, confusion.  Psych: Negative for  anxiety, depression, suicidal ideation, hallucinations.  Endo: Negative for unusual weight change.  Heme: Negative for bruising or bleeding. Allergy: Negative for rash or hives.    Physical Examination:  BP 138/81 mmHg  Pulse 85  Temp(Src) 96.5 F (35.8 C)  Ht 5\' 3"  (1.6 m)  Wt 154 lb 9.6 oz (70.126 kg)  BMI 27.39 kg/m2   General: Well-nourished, well-developed in no acute distress.  Head: Normocephalic, atraumatic.   Eyes: Conjunctiva pink, no icterus. Mouth: Oropharyngeal mucosa moist and pink , no lesions erythema or exudate. Neck: Supple without thyromegaly, masses, or lymphadenopathy.  Lungs: Clear to auscultation bilaterally.  Heart: Regular rate and rhythm, no murmurs rubs or gallops.  Abdomen: Bowel sounds are normal, nontender, nondistended, no hepatosplenomegaly or masses, no abdominal bruits or    hernia , no rebound or guarding.   Rectal: Not performed Extremities: No lower extremity edema. No clubbing or deformities.  Neuro: Alert and oriented x 4 , grossly normal neurologically.  Skin: Warm and dry, no rash or jaundice.   Psych: Alert and cooperative, normal mood and affect.

## 2014-04-26 NOTE — Interval H&P Note (Signed)
History and Physical Interval Note:  04/26/2014 7:38 AM  Michele Pace  has presented today for surgery, with the diagnosis of history of polyps  The various methods of treatment have been discussed with the patient and family. After consideration of risks, benefits and other options for treatment, the patient has consented to  Procedure(s) with comments: COLONOSCOPY (N/A) - 730  as a surgical intervention .  The patient's history has been reviewed, patient examined, no change in status, stable for surgery.  I have reviewed the patient's chart and labs.  Questions were answered to the patient's satisfaction.     [name]  No change. Surveillance colonoscopy per plan.The risks, benefits, limitations, alternatives and imponderables have been reviewed with the patient. Questions have been answered. All parties are agreeable.

## 2014-04-26 NOTE — Discharge Instructions (Signed)
Colonoscopy Discharge Instructions  Read the instructions outlined below and refer to this sheet in the next few weeks. These discharge instructions provide you with general information on caring for yourself after you leave the hospital. Your doctor may also give you specific instructions. While your treatment has been planned according to the most current medical practices available, unavoidable complications occasionally occur. If you have any problems or questions after discharge, call Dr. Gala Romney at [phone]. ACTIVITY  You may resume your regular activity, but move at a slower pace for the next 24 hours.   Take frequent rest periods for the next 24 hours.   Walking will help get rid of the air and reduce the bloated feeling in your belly (abdomen).   No driving for 24 hours (because of the medicine (anesthesia) used during the test).    Do not sign any important legal documents or operate any machinery for 24 hours (because of the anesthesia used during the test).  NUTRITION  Drink plenty of fluids.   You may resume your normal diet as instructed by your doctor.   Begin with a light meal and progress to your normal diet. Heavy or fried foods are harder to digest and may make you feel sick to your stomach (nauseated).   Avoid alcoholic beverages for 24 hours or as instructed.  MEDICATIONS  You may resume your normal medications unless your doctor tells you otherwise.  WHAT YOU CAN EXPECT TODAY  Some feelings of bloating in the abdomen.   Passage of more gas than usual.   Spotting of blood in your stool or on the toilet paper.  IF YOU HAD POLYPS REMOVED DURING THE COLONOSCOPY:  No aspirin products for 7 days or as instructed.   No alcohol for 7 days or as instructed.   Eat a soft diet for the next 24 hours.  FINDING OUT THE RESULTS OF YOUR TEST Not all test results are available during your visit. If your test results are not back during the visit, make an appointment  with your caregiver to find out the results. Do not assume everything is normal if you have not heard from your caregiver or the medical facility. It is important for you to follow up on all of your test results.  SEEK IMMEDIATE MEDICAL ATTENTION IF:  You have more than a spotting of blood in your stool.   Your belly is swollen (abdominal distention).   You are nauseated or vomiting.   You have a temperature over 101.   You have abdominal pain or discomfort that is severe or gets worse throughout the day.    Diverticulosis and polyp information provided Further recommendations to follow pending review of pathology report  Colon Polyps Polyps are lumps of extra tissue growing inside the body. Polyps can grow in the large intestine (colon). Most colon polyps are noncancerous (benign). However, some colon polyps can become cancerous over time. Polyps that are larger than a pea may be harmful. To be safe, caregivers remove and test all polyps. CAUSES  Polyps form when mutations in the genes cause your cells to grow and divide even though no more tissue is needed. RISK FACTORS There are a number of risk factors that can increase your chances of getting colon polyps. They include:  Being older than 50 years.  Family history of colon polyps or colon cancer.  Long-term colon diseases, such as colitis or Crohn disease.  Being overweight.  Smoking.  Being inactive.  Drinking too much alcohol.  SYMPTOMS  Most small polyps do not cause symptoms. If symptoms are present, they may include:  Blood in the stool. The stool may look dark red or black.  Constipation or diarrhea that lasts longer than 1 week. DIAGNOSIS People often do not know they have polyps until their caregiver finds them during a regular checkup. Your caregiver can use 4 tests to check for polyps:  Digital rectal exam. The caregiver wears gloves and feels inside the rectum. This test would find polyps only in the  rectum.  Barium enema. The caregiver puts a liquid called barium into your rectum before taking X-rays of your colon. Barium makes your colon look white. Polyps are dark, so they are easy to see in the X-ray pictures.  Sigmoidoscopy. A thin, flexible tube (sigmoidoscope) is placed into your rectum. The sigmoidoscope has a light and tiny camera in it. The caregiver uses the sigmoidoscope to look at the last third of your colon.  Colonoscopy. This test is like sigmoidoscopy, but the caregiver looks at the entire colon. This is the most common method for finding and removing polyps. TREATMENT  Any polyps will be removed during a sigmoidoscopy or colonoscopy. The polyps are then tested for cancer. PREVENTION  To help lower your risk of getting more colon polyps:  Eat plenty of fruits and vegetables. Avoid eating fatty foods.  Do not smoke.  Avoid drinking alcohol.  Exercise every day.  Lose weight if recommended by your caregiver.  Eat plenty of calcium and folate. Foods that are rich in calcium include milk, cheese, and broccoli. Foods that are rich in folate include chickpeas, kidney beans, and spinach. HOME CARE INSTRUCTIONS Keep all follow-up appointments as directed by your caregiver. You may need periodic exams to check for polyps. SEEK MEDICAL CARE IF: You notice bleeding during a bowel movement. Document Released: 12/19/2003 Document Revised: 06/16/2011 Document Reviewed: 06/03/2011 [facility] Patient Information 2015 [place], [place]. This information is not intended to replace advice given to you by your health care provider. Make sure you discuss any questions you have with your health care provider. Diverticulosis Diverticulosis is the condition that develops when small pouches (diverticula) form in the wall of your colon. Your colon, or large intestine, is where water is absorbed and stool is formed. The pouches form when the inside layer of your colon pushes through weak spots in  the outer layers of your colon. CAUSES  No one knows exactly what causes diverticulosis. RISK FACTORS Being older than 68. Your risk for this condition increases with age. Diverticulosis is rare in people younger than 40 years. By age 110, almost everyone has it. Eating a low-fiber diet. Being frequently constipated. Being overweight. Not getting enough exercise. Smoking. Taking over-the-counter pain medicines, like aspirin and ibuprofen. SYMPTOMS  Most people with diverticulosis do not have symptoms. DIAGNOSIS  Because diverticulosis often has no symptoms, health care providers often discover the condition during an exam for other colon problems. In many cases, a health care provider will diagnose diverticulosis while using a flexible scope to examine the colon (colonoscopy). TREATMENT  If you have never developed an infection related to diverticulosis, you may not need treatment. If you have had an infection before, treatment may include: Eating more fruits, vegetables, and grains. Taking a fiber supplement. Taking a live bacteria supplement (probiotic). Taking medicine to relax your colon. HOME CARE INSTRUCTIONS  Drink at least 6-8 glasses of water each day to prevent constipation. Try not to strain when you have a bowel movement.  Keep all follow-up appointments. If you have had an infection before: Increase the fiber in your diet as directed by your health care provider or dietitian. Take a dietary fiber supplement if your health care provider approves. Only take medicines as directed by your health care provider. SEEK MEDICAL CARE IF:  You have abdominal pain. You have bloating. You have cramps. You have not gone to the bathroom in 3 days. SEEK IMMEDIATE MEDICAL CARE IF:  Your pain gets worse. Yourbloating becomes very bad. You have a fever or chills, and your symptoms suddenly get worse. You begin vomiting. You have bowel movements that are bloody or black. MAKE SURE  YOU: Understand these instructions. Will watch your condition. Will get help right away if you are not doing well or get worse. Document Released: 12/20/2003 Document Revised: 03/29/2013 Document Reviewed: 02/16/2013 [facility] Patient Information 2015 [place], [place]. This information is not intended to replace advice given to you by your health care provider. Make sure you discuss any questions you have with your health care provider.

## 2014-04-27 ENCOUNTER — Encounter (HOSPITAL_COMMUNITY): Payer: Self-pay | Admitting: Internal Medicine

## 2014-04-28 ENCOUNTER — Encounter: Payer: Self-pay | Admitting: Internal Medicine

## 2017-07-08 ENCOUNTER — Ambulatory Visit: Payer: Medicare Other | Admitting: Gastroenterology

## 2017-07-08 ENCOUNTER — Encounter: Payer: Self-pay | Admitting: Gastroenterology

## 2017-07-08 VITALS — BP 155/94 | HR 84 | Temp 97.0°F | Ht 63.0 in | Wt 162.2 lb

## 2017-07-08 DIAGNOSIS — R1031 Right lower quadrant pain: Secondary | ICD-10-CM | POA: Diagnosis not present

## 2017-07-08 DIAGNOSIS — R198 Other specified symptoms and signs involving the digestive system and abdomen: Secondary | ICD-10-CM | POA: Diagnosis not present

## 2017-07-08 DIAGNOSIS — R194 Change in bowel habit: Secondary | ICD-10-CM

## 2017-07-08 DIAGNOSIS — R140 Abdominal distension (gaseous): Secondary | ICD-10-CM | POA: Diagnosis not present

## 2017-07-08 NOTE — Progress Notes (Signed)
CBC normal. Await other labs and CT.

## 2017-07-08 NOTE — Patient Instructions (Signed)
No PA needed for CT abd/pelvis w/contrast per [facility] website.

## 2017-07-08 NOTE — Progress Notes (Signed)
Primary Care Physician:  Monico Blitz, MD  Primary Gastroenterologist:  Garfield Cornea, MD   Chief Complaint  Patient presents with  . change in bowels    straining with some BM's  . gas/belching  . Abdominal Pain    rlq x few weeks. No pain today    HPI:  Michele Pace is a 77 y.o. female here for further evaluation of change in bowel habits, abdominal pain.  Patient last seen in 2015 for surveillance colonoscopy.  Colonoscopy January 2016 with single tubular adenoma removed, scattered diverticula.  Next colonoscopy planned for 7 years if overall health permits.  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  Patient has a history of GERD, generally heartburn well controlled on omeprazole.  She takes it as needed only.  Complains of change in bowel habits over the last 4-5 months.  Prior to this she would have a bowel movement about twice daily.  Stools usually solid, occasionally hard and had to strain.  Over the past several months she has had alternating constipation with loose stools, more predominant loose stools last couple of months.  Still with solid stools at this time off and on.  Initially tried a stool softener for constipation.  Really not taking any antidiarrheals.  Saw her PCP about a month ago for right lower quadrant pain which started up, suspected UTI and provided antibiotics.  Symptoms were unchanged so she went back to her PCP who performed obtain another urinalysis which was unremarkable.  Because she was having increased frequency of stool she was told she likely had "stomach flu" and put on a bland diet.  Patient states every time she eats something she would have loose stools, abdominal grumbling.  When she eats salad she get diarrhea.  Intermittent colicky right lower quadrant pain.  She would note a feeling of bloating and gets some relief if she urinated or passed gas.  No fever, no travel abroad, no ill contacts.  No antibiotics prior to symptoms.  Recent  antibiotics after symptoms started.  Notes a lot of gas both flatulence and belching.     Current Outpatient Medications  Medication Sig Dispense Refill  . allopurinol (ZYLOPRIM) 100 MG tablet Take 100 mg by mouth daily.     Marland Kitchen amLODipine-benazepril (LOTREL) 5-20 MG per capsule Take 1 capsule by mouth daily.     Marland Kitchen aspirin 81 MG tablet Take 81 mg by mouth daily.    Marland Kitchen b complex vitamins tablet Take 1 tablet by mouth daily.    . calcium citrate (CALCITRATE - DOSED IN MG ELEMENTAL CALCIUM) 950 MG tablet Take 600 mg by mouth daily.    . Cholecalciferol (VITAMIN D3 PO) Take 25 mcg by mouth daily.    . cyanocobalamin 1000 MCG tablet Take 1,000 mcg by mouth daily.     Marland Kitchen loratadine (CLARITIN) 10 MG tablet Take 10 mg by mouth daily.    . multivitamin-iron-minerals-folic acid (CENTRUM) chewable tablet Chew 1 tablet by mouth daily.    Marland Kitchen omega-3 acid ethyl esters (LOVAZA) 1 G capsule Take 1 g by mouth daily.    Marland Kitchen omeprazole (PRILOSEC) 20 MG capsule Take 20 mg by mouth. 3-4 times per week    . pravastatin (PRAVACHOL) 20 MG tablet Take 20 mg by mouth daily.    . Probiotic Product (PROBIOTIC PO) Take by mouth every other day.    . vitamin C (ASCORBIC ACID) 500 MG tablet Take 1,000 mg by mouth daily.     Marland Kitchen  vitamin E 100 UNIT capsule Take 150 Units by mouth daily.     No current facility-administered medications for this visit.     Allergies as of 07/08/2017 - Review Complete 07/08/2017  Allergen Reaction Noted  . Codeine Nausea And Vomiting     Past Medical History:  Diagnosis Date  . Diabetes ([place])    diet controlled  . GERD (gastroesophageal reflux disease)   . Gout   . HTN (hypertension)   . Hx of adenomatous colonic polyps   . Hypercholesteremia     Past Surgical History:  Procedure Laterality Date  . ABDOMINAL HYSTERECTOMY    . COLONOSCOPY  10/25/07   RMR: normal rectum left sided diverticula  . COLONOSCOPY N/A 04/26/2014   Dr. Gala Romney: There are sigmoid diverticula, 3 mm polyp removed  from the descending segment, tubular adenoma.  Next colonoscopy in 7 years  . ESOPHAGOGASTRODUODENOSCOPY  10/25/07   BOF:[id number] esophagogasteic junction focal antral erosions and bular erosions, otherwise normal. mild chronic gastritis.   Marland Kitchen PARATHYROIDECTOMY  1999/2000   removed two    Family History  Problem Relation Age of Onset  . Colon cancer Paternal Aunt   . Heart disease Unknown     Social History   Socioeconomic History  . Marital status: Widowed    Spouse name: Not on file  . Number of children: Not on file  . Years of education: Not on file  . Highest education level: Not on file  Occupational History  . Occupation: retired Tour manager  . Financial resource strain: Not on file  . Food insecurity:    Worry: Not on file    Inability: Not on file  . Transportation needs:    Medical: Not on file    Non-medical: Not on file  Tobacco Use  . Smoking status: Former Smoker    Packs/day: 0.25    Years: 25.00    Pack years: 6.25    Types: Cigarettes    Last attempt to quit: 04/26/2008    Years since quitting: 9.2  . Smokeless tobacco: Former Systems developer    Quit date: 09/05/2008  Substance and Sexual Activity  . Alcohol use: No    Alcohol/week: 0.0 oz  . Drug use: No  . Sexual activity: Yes    Birth control/protection: Surgical  Lifestyle  . Physical activity:    Days per week: Not on file    Minutes per session: Not on file  . Stress: Not on file  Relationships  . Social connections:    Talks on phone: Not on file    Gets together: Not on file    Attends religious service: Not on file    Active member of club or organization: Not on file    Attends meetings of clubs or organizations: Not on file    Relationship status: Not on file  . Intimate partner violence:    Fear of current or ex partner: Not on file    Emotionally abused: Not on file    Physically abused: Not on file    Forced sexual activity: Not on file  Other Topics Concern  . Not on file   Social History Narrative   Adopted daughter      ROS:  General: Negative for anorexia, weight loss, fever, chills, fatigue, weakness. Eyes: Negative for vision changes.  ENT: Negative for hoarseness, difficulty swallowing , nasal congestion. CV: Negative for chest pain, angina, palpitations, dyspnea on exertion, peripheral edema.  Respiratory: Negative for dyspnea at rest,  dyspnea on exertion, cough, sputum, wheezing.  GI: See history of present illness. GU:  Negative for dysuria, hematuria, urinary incontinence, urinary frequency, nocturnal urination.  MS: Negative for joint pain, low back pain.  Derm: Negative for rash or itching.  Neuro: Negative for weakness, abnormal sensation, seizure, frequent headaches, memory loss, confusion.  Psych: Negative for anxiety, depression, suicidal ideation, hallucinations.  Endo: Negative for unusual weight change.  Heme: Negative for bruising or bleeding. Allergy: Negative for rash or hives.    Physical Examination:  BP (!) 155/94   Pulse 84   Temp (!) 97 F (36.1 C) (Oral)   Ht 5\' 3"  (1.6 m)   Wt 162 lb 3.2 oz (73.6 kg)   BMI 28.73 kg/m    General: Well-nourished, well-developed in no acute distress.  Head: Normocephalic, atraumatic.   Eyes: Conjunctiva pink, no icterus. Mouth: Oropharyngeal mucosa moist and pink , no lesions erythema or exudate. Neck: Supple without thyromegaly, masses, or lymphadenopathy.  Lungs: Clear to auscultation bilaterally.  Heart: Regular rate and rhythm, no murmurs rubs or gallops.  Abdomen: Bowel sounds are normal,  nondistended, no hepatosplenomegaly or masses, no abdominal bruits or    hernia , no rebound or guarding.  Mild tenderness in the mid to right lower quadrant. Rectal: Not performed Extremities: No lower extremity edema. No clubbing or deformities.  Neuro: Alert and oriented x 4 , grossly normal neurologically.  Skin: Warm and dry, no rash or jaundice.   Psych: Alert and cooperative, normal  mood and affect.    Imaging Studies: No results found.

## 2017-07-08 NOTE — Patient Instructions (Signed)
1. Labs and CT scan as scheduled.

## 2017-07-08 NOTE — Assessment & Plan Note (Signed)
77 year old female with several month history of change in bowel habits now having alternating constipation and diarrhea with tendency towards more diarrhea this past month.  Complains of abdominal bloating, right lower quadrant discomfort.  His last colonoscopy in 2016.  At this time recommend CT abdomen and pelvis for further evaluation of abdominal pain/bloating/change in bowel habits.  This is unlikely related to infectious etiology.  Obtain labs today.  Further recommendations to follow

## 2017-07-09 LAB — CBC WITH DIFFERENTIAL/PLATELET
BASOS PCT: 0.6 %
Basophils Absolute: 57 cells/uL (ref 0–200)
Eosinophils Absolute: 171 cells/uL (ref 15–500)
Eosinophils Relative: 1.8 %
HCT: 43.8 % (ref 35.0–45.0)
Hemoglobin: 15.1 g/dL (ref 11.7–15.5)
Lymphs Abs: 3686 cells/uL (ref 850–3900)
MCH: 31.5 pg (ref 27.0–33.0)
MCHC: 34.5 g/dL (ref 32.0–36.0)
MCV: 91.4 fL (ref 80.0–100.0)
MPV: 12.1 fL (ref 7.5–12.5)
Monocytes Relative: 6.8 %
Neutro Abs: 4940 cells/uL (ref 1500–7800)
Neutrophils Relative %: 52 %
PLATELETS: 262 10*3/uL (ref 140–400)
RBC: 4.79 10*6/uL (ref 3.80–5.10)
RDW: 12.2 % (ref 11.0–15.0)
TOTAL LYMPHOCYTE: 38.8 %
WBC: 9.5 10*3/uL (ref 3.8–10.8)
WBCMIX: 646 {cells}/uL (ref 200–950)

## 2017-07-09 LAB — COMPREHENSIVE METABOLIC PANEL
AG Ratio: 1.6 (calc) (ref 1.0–2.5)
ALKALINE PHOSPHATASE (APISO): 72 U/L (ref 33–130)
ALT: 20 U/L (ref 6–29)
AST: 15 U/L (ref 10–35)
Albumin: 4.4 g/dL (ref 3.6–5.1)
BUN: 18 mg/dL (ref 7–25)
CHLORIDE: 101 mmol/L (ref 98–110)
CO2: 29 mmol/L (ref 20–32)
CREATININE: 0.74 mg/dL (ref 0.60–0.93)
Calcium: 10.9 mg/dL — ABNORMAL HIGH (ref 8.6–10.4)
GLOBULIN: 2.7 g/dL (ref 1.9–3.7)
Glucose, Bld: 104 mg/dL (ref 65–139)
Potassium: 4.1 mmol/L (ref 3.5–5.3)
Sodium: 138 mmol/L (ref 135–146)
Total Bilirubin: 0.3 mg/dL (ref 0.2–1.2)
Total Protein: 7.1 g/dL (ref 6.1–8.1)

## 2017-07-09 NOTE — Progress Notes (Signed)
Calcium slightly elevated. Await CT

## 2017-07-09 NOTE — Progress Notes (Signed)
CC'D TO PCP °

## 2017-07-24 ENCOUNTER — Ambulatory Visit (HOSPITAL_COMMUNITY): Admission: RE | Admit: 2017-07-24 | Payer: Medicare Other | Source: Ambulatory Visit

## 2017-08-10 ENCOUNTER — Ambulatory Visit (HOSPITAL_COMMUNITY)
Admission: RE | Admit: 2017-08-10 | Discharge: 2017-08-10 | Disposition: A | Discharge status: Home / Self Care | Payer: Medicare Other | Source: Ambulatory Visit | Attending: Gastroenterology | Admitting: Gastroenterology

## 2017-08-10 DIAGNOSIS — D7389 Other diseases of spleen: Secondary | ICD-10-CM | POA: Insufficient documentation

## 2017-08-10 DIAGNOSIS — R1031 Right lower quadrant pain: Secondary | ICD-10-CM | POA: Diagnosis present

## 2017-08-10 DIAGNOSIS — R140 Abdominal distension (gaseous): Secondary | ICD-10-CM

## 2017-08-10 DIAGNOSIS — I700 Atherosclerosis of aorta: Secondary | ICD-10-CM | POA: Insufficient documentation

## 2017-08-10 DIAGNOSIS — R911 Solitary pulmonary nodule: Secondary | ICD-10-CM | POA: Insufficient documentation

## 2017-08-10 DIAGNOSIS — R194 Change in bowel habit: Secondary | ICD-10-CM

## 2017-08-10 MED ORDER — IOPAMIDOL (ISOVUE-300) INJECTION 61%
100.0000 mL | Freq: Once | INTRAVENOUS | Status: AC | PRN
  Administered 2017-08-10: 100 mL via INTRAVENOUS

## 2017-08-27 ENCOUNTER — Other Ambulatory Visit: Payer: Self-pay

## 2017-08-28 ENCOUNTER — Encounter: Payer: Self-pay | Admitting: Internal Medicine

## 2017-08-28 NOTE — Progress Notes (Signed)
PATIENT SCHEDULED AND LETTER SENT  °

## 2017-09-02 ENCOUNTER — Other Ambulatory Visit: Payer: Self-pay

## 2017-09-02 MED ORDER — PANCRELIPASE (LIP-PROT-AMYL) 40000-126000 UNITS PO CPEP: 2.0000 | ORAL_CAPSULE | Freq: Four times a day (QID) | ORAL | 0 refills | Status: DC

## 2017-09-02 MED ORDER — PANCRELIPASE (LIP-PROT-AMYL) 40000-126000 UNITS PO CPEP: 2.0000 | ORAL_CAPSULE | Freq: Three times a day (TID) | ORAL | 0 refills | Status: DC

## 2017-09-02 NOTE — Addendum Note (Signed)
Addended by: Mahala Menghini on: 09/02/2017 02:31 PM   Modules accepted: Orders

## 2017-11-02 NOTE — Progress Notes (Signed)
ON RECALL FOR CT

## 2017-12-22 ENCOUNTER — Ambulatory Visit: Payer: Medicare Other | Admitting: Internal Medicine

## 2017-12-22 ENCOUNTER — Encounter: Payer: Self-pay | Admitting: Internal Medicine

## 2017-12-22 VITALS — BP 145/82 | HR 85 | Temp 97.0°F | Ht 63.0 in | Wt 161.2 lb

## 2017-12-22 DIAGNOSIS — R197 Diarrhea, unspecified: Secondary | ICD-10-CM | POA: Diagnosis not present

## 2017-12-22 DIAGNOSIS — R140 Abdominal distension (gaseous): Secondary | ICD-10-CM | POA: Diagnosis not present

## 2017-12-22 NOTE — Patient Instructions (Signed)
Repeat Chest CT in January 2020 - follow-up pulmonary nodule  Call if recurrent diarrhea or abdominal discomfort  Office visit in 6 months

## 2017-12-22 NOTE — Progress Notes (Signed)
Primary Care Physician:  Monico Blitz, MD Primary Gastroenterologist:  Dr. Gala Romney  Pre-Procedure History & Physical: HPI:  Michele Pace is a 77 y.o. female here for follow-up of abdominal cramps and diarrhea. States diarrhea and abdominal cramps have resolved. She said none of these symptoms for the past 2-3 months,   Weight is up 7 pounds. Has 1-2 formed bowel movements daily;  no melena or rectal bleeding. Calcifications in pancreas on prior CT. Pulmonary nodule-needs follow-up. Slightly elevated calcium being followed by Dr. Manuella Ghazi.  She is stressed out/ fatigued taking care of her sister -  had multiple health issues recently.  Past Medical History:  Diagnosis Date  . Diabetes ([place])    diet controlled  . GERD (gastroesophageal reflux disease)   . Gout   . HTN (hypertension)   . Hx of adenomatous colonic polyps   . Hypercholesteremia     Past Surgical History:  Procedure Laterality Date  . ABDOMINAL HYSTERECTOMY    . COLONOSCOPY  10/25/07   RMR: normal rectum left sided diverticula  . COLONOSCOPY N/A 04/26/2014   Dr. Gala Romney: There are sigmoid diverticula, 3 mm polyp removed from the descending segment, tubular adenoma.  Next colonoscopy in 7 years  . ESOPHAGOGASTRODUODENOSCOPY  10/25/07   [facility] esophagogasteic junction focal antral erosions and bular erosions, otherwise normal. mild chronic gastritis.   Marland Kitchen PARATHYROIDECTOMY  1999/2000   removed two    Prior to Admission medications   Medication Sig Start Date End Date Taking? Authorizing Provider  allopurinol (ZYLOPRIM) 100 MG tablet Take 100 mg by mouth daily.    Yes [provider]  amLODipine-benazepril (LOTREL) 5-20 MG per capsule Take 1 capsule by mouth daily.    Yes [provider]  aspirin 81 MG tablet Take 81 mg by mouth daily.   Yes [provider]  b complex vitamins tablet Take 1 tablet by mouth daily.   Yes [provider]  calcium citrate (CALCITRATE - DOSED IN MG ELEMENTAL  CALCIUM) 950 MG tablet Take 600 mg by mouth daily.   Yes [provider]  Cholecalciferol (VITAMIN D3 PO) Take 25 mcg by mouth daily.   Yes [provider]  cyanocobalamin 1000 MCG tablet Take 1,000 mcg by mouth daily.    Yes [provider]  loratadine (CLARITIN) 10 MG tablet Take 10 mg by mouth daily.   Yes [provider]  Misc Natural Products (TART CHERRY ADVANCED PO) Take 1 tablet by mouth daily.   Yes [provider]  multivitamin-iron-minerals-folic acid (CENTRUM) chewable tablet Chew 1 tablet by mouth daily.   Yes [provider]  omega-3 acid ethyl esters (LOVAZA) 1 G capsule Take 1 g by mouth daily.   Yes [provider]  omeprazole (PRILOSEC) 20 MG capsule Take 20 mg by mouth. 3-4 times per week   Yes [provider]  pravastatin (PRAVACHOL) 20 MG tablet Take 20 mg by mouth daily.   Yes [provider]  Probiotic Product (PROBIOTIC PO) Take by mouth every other day.   Yes [provider]  vitamin C (ASCORBIC ACID) 500 MG tablet Take 1,000 mg by mouth daily.    Yes [provider]  vitamin E 100 UNIT capsule Take 150 Units by mouth daily.   Yes [provider]  Pancrelipase, Lip-Prot-Amyl, (ZENPEP) 40000-126000 units CPEP Take 2 capsules by mouth 3 (three) times daily with meals. And 1 capsule by mouth with snacks. Total of 8 per day. Patient not taking: Reported  on 12/22/2017 09/02/17   Mahala Menghini, PA-C    Allergies as of 12/22/2017 - Review Complete 12/22/2017  Allergen Reaction Noted  . Codeine Nausea And Vomiting     Family History  Problem Relation Age of Onset  . Colon cancer Paternal Aunt   . Heart disease Unknown     Social History   Socioeconomic History  . Marital status: Widowed    Spouse name: Not on file  . Number of children: Not on file  . Years of education: Not on file  . Highest education level: Not on file  Occupational History  . Occupation:  retired Tour manager  . Financial resource strain: Not on file  . Food insecurity:    Worry: Not on file    Inability: Not on file  . Transportation needs:    Medical: Not on file    Non-medical: Not on file  Tobacco Use  . Smoking status: Current Every Day Smoker    Packs/day: 0.25    Years: 25.00    Pack years: 6.25    Types: Cigarettes  . Smokeless tobacco: Former Systems developer    Quit date: 09/05/2008  Substance and Sexual Activity  . Alcohol use: No    Alcohol/week: 0.0 standard drinks  . Drug use: No  . Sexual activity: Yes    Birth control/protection: Surgical  Lifestyle  . Physical activity:    Days per week: Not on file    Minutes per session: Not on file  . Stress: Not on file  Relationships  . Social connections:    Talks on phone: Not on file    Gets together: Not on file    Attends religious service: Not on file    Active member of club or organization: Not on file    Attends meetings of clubs or organizations: Not on file    Relationship status: Not on file  . Intimate partner violence:    Fear of current or ex partner: Not on file    Emotionally abused: Not on file    Physically abused: Not on file    Forced sexual activity: Not on file  Other Topics Concern  . Not on file  Social History Narrative   Adopted daughter    Review of Systems: See HPI, otherwise negative ROS  Physical Exam: BP (!) 145/82   Pulse 85   Temp (!) 97 F (36.1 C) (Oral)   Ht 5\' 3"  (1.6 m)   Wt 161 lb 3.2 oz (73.1 kg)   BMI 28.56 kg/m  General:   Alert,   pleasant and cooperative in NAD Neck:  Supple; no masses or thyromegaly. No significant cervical adenopathy. Lungs:  Clear throughout to auscultation.   No wheezes, crackles, or rhonchi. No acute distress. Heart:  Regular rate and rhythm; no murmurs, clicks, rubs,  or gallops. Abdomen: Non-distended, normal bowel sounds.  Soft and nontender without appreciable mass or hepatosplenomegaly.  Pulses:  Normal pulses  noted. Extremities:  Without clubbing or edema.  Impression/Plan:  Diarrhea and abdominal discomfort has resolved for the time being after course of the pancreatic enzyme supplements. This may or may not be a coincidence. 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 She has diabetes.  Hypercalcemia being addressed by PCP.  Right lower lobe pulmonary nodule needs a follow-up CT.  No further GI intervention warranted at this time.  Recommendations:  Repeat Chest CT in January 2020 - follow-up pulmonary nodule  Call if recurrent diarrhea  or abdominal discomfort  Office visit in 6 months        Notice: This dictation was prepared with Dragon dictation along with smaller phrase technology. Any transcriptional errors that result from this process are unintentional and may not be corrected upon review.

## 2018-03-29 ENCOUNTER — Telehealth: Payer: Self-pay | Admitting: Internal Medicine

## 2018-03-29 NOTE — Telephone Encounter (Signed)
RECALL FOR CT CHEST ?

## 2018-03-29 NOTE — Telephone Encounter (Signed)
Letter mailed

## 2018-06-01 NOTE — Patient Instructions (Signed)
Your procedure is scheduled on: 06/14/2018  Report to [facility] at  [phone]  AM.  Call this number if you have problems the morning of surgery: [phone]   Do not eat food or drink liquids :After Midnight.      Take these medicines the morning of surgery with A SIP OF WATER: allopurinol, amlodipine, claritin, prilosec.   Do not wear jewelry, make-up or nail polish.  Do not wear lotions, powders, or perfumes. You may wear deodorant.  Do not shave 48 hours prior to surgery.  Do not bring valuables to the hospital.  Contacts, dentures or bridgework may not be worn into surgery.  Leave suitcase in the car. After surgery it may be brought to your room.  For patients admitted to the hospital, checkout time is 11:00 AM the day of discharge.   Patients discharged the day of surgery will not be allowed to drive home.  :     Please read over the following fact sheets that you were given: Coughing and Deep Breathing, Surgical Site Infection Prevention, Anesthesia Post-op Instructions and Care and Recovery After Surgery    Cataract A cataract is a clouding of the lens of the eye. When a lens becomes cloudy, vision is reduced based on the degree and nature of the clouding. Many cataracts reduce vision to some degree. Some cataracts make people more near-sighted as they develop. Other cataracts increase glare. Cataracts that are ignored and become worse can sometimes look white. The white color can be seen through the pupil. CAUSES   Aging. However, cataracts may occur at any age, even in newborns.   Certain drugs.   Trauma to the eye.   Certain diseases such as diabetes.   Specific eye diseases such as chronic inflammation inside the eye or a sudden attack of a rare form of glaucoma.   Inherited or acquired medical problems.  SYMPTOMS   Gradual, progressive drop in vision in the affected eye.   Severe, rapid visual loss. This most often happens when trauma is the cause.  DIAGNOSIS  To  detect a cataract, an eye doctor examines the lens. Cataracts are best diagnosed with an exam of the eyes with the pupils enlarged (dilated) by drops.  TREATMENT  For an early cataract, vision may improve by using different eyeglasses or stronger lighting. If that does not help your vision, surgery is the only effective treatment. A cataract needs to be surgically removed when vision loss interferes with your everyday activities, such as driving, reading, or watching TV. A cataract may also have to be removed if it prevents examination or treatment of another eye problem. Surgery removes the cloudy lens and usually replaces it with a substitute lens (intraocular lens, IOL).  At a time when both you and your doctor agree, the cataract will be surgically removed. If you have cataracts in both eyes, only one is usually removed at a time. This allows the operated eye to heal and be out of danger from any possible problems after surgery (such as infection or poor wound healing). In rare cases, a cataract may be doing damage to your eye. In these cases, your caregiver may advise surgical removal right away. The vast majority of people who have cataract surgery have better vision afterward. HOME CARE INSTRUCTIONS  If you are not planning surgery, you may be asked to do the following:  Use different eyeglasses.   Use stronger or brighter lighting.   Ask your eye doctor about reducing your  medicine dose or changing medicines if it is thought that a medicine caused your cataract. Changing medicines does not make the cataract go away on its own.   Become familiar with your surroundings. Poor vision can lead to injury. Avoid bumping into things on the affected side. You are at a higher risk for tripping or falling.   Exercise extreme care when driving or operating machinery.   Wear sunglasses if you are sensitive to bright light or experiencing problems with glare.  SEEK IMMEDIATE MEDICAL CARE IF:   You have  a worsening or sudden vision loss.   You notice redness, swelling, or increasing pain in the eye.   You have a fever.  Document Released: 03/24/2005 Document Revised: 03/13/2011 Document Reviewed: 11/15/2010 [facility] Patient Information 2012 [place].PATIENT INSTRUCTIONS POST-ANESTHESIA  IMMEDIATELY FOLLOWING SURGERY:  Do not drive or operate machinery for the first twenty four hours after surgery.  Do not make any important decisions for twenty four hours after surgery or while taking narcotic pain medications or sedatives.  If you develop intractable nausea and vomiting or a severe headache please notify your doctor immediately.  FOLLOW-UP:  Please make an appointment with your surgeon as instructed. You do not need to follow up with anesthesia unless specifically instructed to do so.  WOUND CARE INSTRUCTIONS (if applicable):  Keep a dry clean dressing on the anesthesia/puncture wound site if there is drainage.  Once the wound has quit draining you may leave it open to air.  Generally you should leave the bandage intact for twenty four hours unless there is drainage.  If the epidural site drains for more than 36-48 hours please call the anesthesia department.  QUESTIONS?:  Please feel free to call your physician or the hospital operator if you have any questions, and they will be happy to assist you.

## 2018-06-07 ENCOUNTER — Other Ambulatory Visit: Payer: Self-pay

## 2018-06-07 ENCOUNTER — Encounter (HOSPITAL_COMMUNITY): Payer: Self-pay

## 2018-06-07 ENCOUNTER — Encounter (HOSPITAL_COMMUNITY)
Admission: RE | Admit: 2018-06-07 | Discharge: 2018-06-07 | Disposition: A | Discharge status: Home / Self Care | Payer: Medicare Other | Source: Ambulatory Visit | Attending: Ophthalmology | Admitting: Ophthalmology

## 2018-06-07 DIAGNOSIS — Z01812 Encounter for preprocedural laboratory examination: Secondary | ICD-10-CM | POA: Diagnosis not present

## 2018-06-07 LAB — BASIC METABOLIC PANEL
ANION GAP: 11 (ref 5–15)
BUN: 17 mg/dL (ref 8–23)
CO2: 24 mmol/L (ref 22–32)
Calcium: 10.1 mg/dL (ref 8.9–10.3)
Chloride: 102 mmol/L (ref 98–111)
Creatinine, Ser: 0.81 mg/dL (ref 0.44–1.00)
GFR calc Af Amer: >60 mL/min (ref >60)
GFR calc non Af Amer: >60 mL/min (ref >60)
Glucose, Bld: 255 mg/dL — ABNORMAL HIGH (ref 70–99)
POTASSIUM: 3.9 mmol/L (ref 3.5–5.1)
Sodium: 137 mmol/L (ref 135–145)

## 2018-06-14 ENCOUNTER — Ambulatory Visit (HOSPITAL_COMMUNITY): Payer: Medicare Other | Admitting: Anesthesiology

## 2018-06-14 ENCOUNTER — Encounter (HOSPITAL_COMMUNITY): Payer: Self-pay | Admitting: Anesthesiology

## 2018-06-14 ENCOUNTER — Ambulatory Visit (HOSPITAL_COMMUNITY)
Admission: RE | Admit: 2018-06-14 | Discharge: 2018-06-14 | Disposition: A | Discharge status: Home / Self Care | Payer: Medicare Other | Attending: Ophthalmology | Admitting: Ophthalmology

## 2018-06-14 ENCOUNTER — Other Ambulatory Visit: Payer: Self-pay

## 2018-06-14 ENCOUNTER — Encounter (HOSPITAL_COMMUNITY)
Admission: RE | Disposition: A | Discharge status: Home / Self Care | Payer: Self-pay | Source: Home / Self Care | Attending: Ophthalmology

## 2018-06-14 DIAGNOSIS — Z79899 Other long term (current) drug therapy: Secondary | ICD-10-CM | POA: Insufficient documentation

## 2018-06-14 DIAGNOSIS — I10 Essential (primary) hypertension: Secondary | ICD-10-CM | POA: Diagnosis not present

## 2018-06-14 DIAGNOSIS — F17200 Nicotine dependence, unspecified, uncomplicated: Secondary | ICD-10-CM | POA: Diagnosis not present

## 2018-06-14 DIAGNOSIS — Z885 Allergy status to narcotic agent status: Secondary | ICD-10-CM | POA: Diagnosis not present

## 2018-06-14 DIAGNOSIS — H2589 Other age-related cataract: Secondary | ICD-10-CM | POA: Insufficient documentation

## 2018-06-14 DIAGNOSIS — E1136 Type 2 diabetes mellitus with diabetic cataract: Secondary | ICD-10-CM | POA: Diagnosis not present

## 2018-06-14 HISTORY — PX: CATARACT EXTRACTION W/PHACO: SHX586

## 2018-06-14 LAB — GLUCOSE, CAPILLARY: Glucose-Capillary: 104 mg/dL — ABNORMAL HIGH (ref 70–99)

## 2018-06-14 SURGERY — PHACOEMULSIFICATION, CATARACT, WITH IOL INSERTION
Anesthesia: General | Site: Eye | Laterality: Left

## 2018-06-14 MED ORDER — PROVISC 10 MG/ML IO SOLN
INTRAOCULAR | Status: DC | PRN
  Administered 2018-06-14: 0.85 mL via INTRAOCULAR

## 2018-06-14 MED ORDER — SODIUM HYALURONATE 23 MG/ML IO SOLN
INTRAOCULAR | Status: DC | PRN
  Administered 2018-06-14: 0.6 mL via INTRAOCULAR

## 2018-06-14 MED ORDER — PHENYLEPHRINE HCL 2.5 % OP SOLN
1.0000 [drp] | OPHTHALMIC | Status: AC
  Administered 2018-06-14 (×3): 1 [drp] via OPHTHALMIC

## 2018-06-14 MED ORDER — BSS IO SOLN
INTRAOCULAR | Status: DC | PRN
  Administered 2018-06-14: 15 mL

## 2018-06-14 MED ORDER — LIDOCAINE HCL (PF) 1 % IJ SOLN
INTRAOCULAR | Status: DC | PRN
  Administered 2018-06-14: 1 mL via OPHTHALMIC

## 2018-06-14 MED ORDER — EPINEPHRINE PF 1 MG/ML IJ SOLN
INTRAOCULAR | Status: DC | PRN
  Administered 2018-06-14: 500 mL

## 2018-06-14 MED ORDER — LIDOCAINE HCL 3.5 % OP GEL
1.0000 "application " | Freq: Once | OPHTHALMIC | Status: AC
  Administered 2018-06-14: 1 via OPHTHALMIC

## 2018-06-14 MED ORDER — NEOMYCIN-POLYMYXIN-DEXAMETH 3.5-10000-0.1 OP SUSP
OPHTHALMIC | Status: DC | PRN
  Administered 2018-06-14: 2 [drp] via OPHTHALMIC

## 2018-06-14 MED ORDER — POVIDONE-IODINE 5 % OP SOLN
OPHTHALMIC | Status: DC | PRN
  Administered 2018-06-14: 1 via OPHTHALMIC

## 2018-06-14 MED ORDER — CYCLOPENTOLATE-PHENYLEPHRINE 0.2-1 % OP SOLN
1.0000 [drp] | OPHTHALMIC | Status: AC
  Administered 2018-06-14 (×3): 1 [drp] via OPHTHALMIC

## 2018-06-14 MED ORDER — TETRACAINE HCL 0.5 % OP SOLN
1.0000 [drp] | OPHTHALMIC | Status: AC
  Administered 2018-06-14 (×3): 1 [drp] via OPHTHALMIC

## 2018-06-14 SURGICAL SUPPLY — 14 items
CLOTH BEACON ORANGE TIMEOUT ST (SAFETY) ×2 IMPLANT
EYE SHIELD UNIVERSAL CLEAR (GAUZE/BANDAGES/DRESSINGS) ×2 IMPLANT
GLOVE BIOGEL PI IND STRL 6.5 (GLOVE) IMPLANT
GLOVE BIOGEL PI IND STRL 7.0 (GLOVE) IMPLANT
GLOVE BIOGEL PI INDICATOR 6.5 (GLOVE) ×2
GLOVE BIOGEL PI INDICATOR 7.0 (GLOVE) ×2
LENS ALC ACRYL/TECN (Ophthalmic Related) ×2 IMPLANT
NDL HYPO 18GX1.5 BLUNT FILL (NEEDLE) IMPLANT
NEEDLE HYPO 18GX1.5 BLUNT FILL (NEEDLE) ×3 IMPLANT
PAD ARMBOARD 7.5X6 YLW CONV (MISCELLANEOUS) ×2 IMPLANT
TAPE SURG TRANSPORE 1 IN (GAUZE/BANDAGES/DRESSINGS) IMPLANT
TAPE SURGICAL TRANSPORE 1 IN (GAUZE/BANDAGES/DRESSINGS) ×2
VISCOELASTIC ADDITIONAL (OPHTHALMIC RELATED) ×2 IMPLANT
WATER STERILE IRR 250ML POUR (IV SOLUTION) ×2 IMPLANT

## 2018-06-14 NOTE — H&P (Signed)
The H and P was reviewed and updated. The patient was examined.  No changes were found after exam.  The surgical eye was marked.  

## 2018-06-14 NOTE — Discharge Instructions (Addendum)
Please discharge patient when stable, will follow up today with Dr. Wrzosek at the [facility] office immediately following discharge.  Leave shield in place until visit.  All paperwork with discharge instructions will be given at the office. ° ° ° ° ° °Monitored Anesthesia Care, Care After °These instructions provide you with information about caring for yourself after your procedure. Your health care provider may also give you more specific instructions. Your treatment has been planned according to current medical practices, but problems sometimes occur. Call your health care provider if you have any problems or questions after your procedure. °What can I expect after the procedure? °After your procedure, you may: °· Feel sleepy for several hours. °· Feel clumsy and have poor balance for several hours. °· Feel forgetful about what happened after the procedure. °· Have poor judgment for several hours. °· Feel nauseous or vomit. °· Have a sore throat if you had a breathing tube during the procedure. °Follow these instructions at home: °For at least 24 hours after the procedure: ° °  ° °· Have a responsible adult stay with you. It is important to have someone help care for you until you are awake and alert. °· Rest as needed. °· Do not: °? Participate in activities in which you could fall or become injured. °? Drive. °? Use heavy machinery. °? Drink alcohol. °? Take sleeping pills or medicines that cause drowsiness. °? Make important decisions or sign legal documents. °? Take care of children on your own. °Eating and drinking °· Follow the diet that is recommended by your health care provider. °· If you vomit, drink water, juice, or soup when you can drink without vomiting. °· Make sure you have little or no nausea before eating solid foods. °General instructions °· Take over-the-counter and prescription medicines only as told by your health care provider. °· If you have sleep apnea, surgery and certain  medicines can increase your risk for breathing problems. Follow instructions from your health care provider about wearing your sleep device: °? Anytime you are sleeping, including during daytime naps. °? While taking prescription pain medicines, sleeping medicines, or medicines that make you drowsy. °· If you smoke, do not smoke without supervision. °· Keep all follow-up visits as told by your health care provider. This is important. °Contact a health care provider if: °· You keep feeling nauseous or you keep vomiting. °· You feel light-headed. °· You develop a rash. °· You have a fever. °Get help right away if: °· You have trouble breathing. °Summary °· For several hours after your procedure, you may feel sleepy and have poor judgment. °· Have a responsible adult stay with you for at least 24 hours or until you are awake and alert. °This information is not intended to replace advice given to you by your health care provider. Make sure you discuss any questions you have with your health care provider. °Document Released: 07/15/2015 Document Revised: 11/07/2016 Document Reviewed: 07/15/2015 °Elsevier Interactive Patient Education © 2019 [employer] ° °

## 2018-06-14 NOTE — Transfer of Care (Addendum)
Immediate Anesthesia Transfer of Care Note  Patient: Michele Pace  Procedure(s) Performed: CATARACT EXTRACTION PHACO AND INTRAOCULAR LENS PLACEMENT (IOC) (Left Eye)  Patient Location: Short Stay  Anesthesia Type:MAC  Level of Consciousness: awake, alert , oriented and patient cooperative  Airway & Oxygen Therapy: Patient Spontanous Breathing  Post-op Assessment: Report given to RN and Post -op Vital signs reviewed and stable  Post vital signs: Reviewed and stable  Last Vitals:  Vitals Value Taken Time  BP    Temp    Pulse    Resp    SpO2      Last Pain:  Vitals:   06/14/18 0905  PainSc: 0-No pain         Complications: No apparent anesthesia complications

## 2018-06-14 NOTE — Op Note (Signed)
Date of procedure: 06/14/18  Pre-operative diagnosis: Visually significant age-related cataract, Left Eye (H25.812)  Post-operative diagnosis: Visually significant age-related cataract, Left Eye  Procedure: Removal of cataract via phacoemulsification and insertion of intra-ocular lens [place]  +14.0D into the capsular bag of the Left Eye  Attending surgeon: Gerda Diss. Cortez Steelman, MD, MA  Anesthesia: MAC, Topical [place]  Complications: None  Estimated Blood Loss: <17m (minimal)  Specimens: None  Implants: As above  Indications:  Visually significant age-related cataract, Left Eye  Procedure:  The patient was seen and identified in the pre-operative area. The operative eye was identified and dilated.  The operative eye was marked.  Topical anesthesia was administered to the operative eye.     The patient was then to the operative suite and placed in the supine position.  A timeout was performed confirming the patient, procedure to be performed, and all other relevant information.   The patient's face was prepped and draped in the usual fashion for intra-ocular surgery.  A lid speculum was placed into the operative eye and the surgical microscope moved into place and focused.  An inferotemporal paracentesis was created using a 20 gauge paracentesis blade.  Shugarcaine was injected into the anterior chamber.  Viscoelastic was injected into the anterior chamber.  A temporal clear-corneal main wound incision was created using a 2.475mmicrokeratome.  A continuous curvilinear capsulorrhexis was initiated using an irrigating cystitome and completed using capsulorrhexis forceps.  Hydrodissection and hydrodeliniation were performed.  Viscoelastic was injected into the anterior chamber.  A phacoemulsification handpiece and a chopper as a second instrument were used to remove the nucleus and epinucleus. The irrigation/aspiration handpiece was used to remove any remaining cortical  material.   The capsular bag was reinflated with viscoelastic, checked, and found to be intact.  The intraocular lens was inserted into the capsular bag and dialed into place using a Kuglen hook.  The irrigation/aspiration handpiece was used to remove any remaining viscoelastic.  The clear corneal wound and paracentesis wounds were then hydrated and checked with Weck-Cels to be watertight.  The lid-speculum and drape was removed, and the patient's face was cleaned with a wet and dry 4x4.  Maxitrol was instilled in the eye before a clear shield was taped over the eye. The patient was taken to the post-operative care unit in good condition, having tolerated the procedure well.  [facility]-Op Instructions: [facility] and will receive all other orders and instructions.

## 2018-06-14 NOTE — Anesthesia Postprocedure Evaluation (Signed)
Anesthesia Post Note  Patient: Michele Pace  Procedure(s) Performed: CATARACT EXTRACTION PHACO AND INTRAOCULAR LENS PLACEMENT (IOC) (Left Eye)  Anesthesia Type: General Level of consciousness: awake and alert and oriented Pain management: pain level controlled Vital Signs Assessment: post-procedure vital signs reviewed and stable Respiratory status: spontaneous breathing Cardiovascular status: stable Postop Assessment: no apparent nausea or vomiting Anesthetic complications: no     Last Vitals:  Vitals:   06/14/18 0905 06/14/18 0913  BP: (!) 157/77   Pulse: 70 64  Resp: 16 18  Temp: 36.9 C   SpO2: 99% 100%    Last Pain:  Vitals:   06/14/18 0905  PainSc: 0-No pain                 ADAMS, AMY A

## 2018-06-14 NOTE — Addendum Note (Signed)
Addendum  created 06/14/18 1353 by Mickel Baas, CRNA   Clinical Note Signed, Intraprocedure Flowsheets edited

## 2018-06-14 NOTE — Anesthesia Preprocedure Evaluation (Signed)
Anesthesia Evaluation    Airway Mallampati: II  TM Distance: >3 FB Neck ROM: Full    Dental no notable dental hx. (+) Teeth Intact   Pulmonary Current Smoker,    Pulmonary exam normal breath sounds clear to auscultation       Cardiovascular Exercise Tolerance: Good hypertension, Normal cardiovascular examI Rhythm:Regular Rate:Normal     Neuro/Psych  Neuromuscular disease    GI/Hepatic GERD  Medicated and Controlled,  Endo/Other  diabetes, Type 2NIDDM- diet only -no current meds   Renal/GU      Musculoskeletal   Abdominal   Peds  Hematology   Anesthesia Other Findings   Reproductive/Obstetrics                             Anesthesia Physical Anesthesia Plan  ASA: II  Anesthesia Plan: General   Post-op Pain Management:    Induction: Intravenous  PONV Risk Score and Plan:   Airway Management Planned: Nasal Cannula  Additional Equipment:   Intra-op Plan:   Post-operative Plan:   Informed Consent: I have reviewed the patients History and Physical, chart, labs and discussed the procedure including the risks, benefits and alternatives for the proposed anesthesia with the patient or authorized representative who has indicated his/her understanding and acceptance.     Dental advisory given  Plan Discussed with: CRNA  Anesthesia Plan Comments:         Anesthesia Quick Evaluation

## 2018-06-14 NOTE — Anesthesia Procedure Notes (Signed)
Procedure Name: MAC Date/Time: 06/14/2018 10:50 AM Performed by: Andree Elk Almalik Weissberg A, CRNA Pre-anesthesia Checklist: Patient identified, Emergency Drugs available, Suction available, Timeout performed and Patient being monitored Patient Re-evaluated:Patient Re-evaluated prior to induction Oxygen Delivery Method: Nasal Cannula

## 2018-06-15 ENCOUNTER — Encounter (HOSPITAL_COMMUNITY): Payer: Self-pay | Admitting: Ophthalmology

## 2018-06-22 ENCOUNTER — Ambulatory Visit: Payer: Medicare Other | Admitting: Gastroenterology

## 2018-06-23 ENCOUNTER — Encounter (HOSPITAL_COMMUNITY)
Admission: RE | Admit: 2018-06-23 | Discharge: 2018-06-23 | Disposition: A | Discharge status: Home / Self Care | Payer: Medicare Other | Source: Ambulatory Visit | Attending: Ophthalmology | Admitting: Ophthalmology

## 2018-06-28 ENCOUNTER — Encounter (HOSPITAL_COMMUNITY): Admission: RE | Payer: Self-pay | Source: Home / Self Care

## 2018-06-28 ENCOUNTER — Ambulatory Visit (HOSPITAL_COMMUNITY): Admission: RE | Admit: 2018-06-28 | Payer: Medicare Other | Source: Home / Self Care | Admitting: Ophthalmology

## 2018-06-28 SURGERY — PHACOEMULSIFICATION, CATARACT, WITH IOL INSERTION
Anesthesia: Monitor Anesthesia Care | Laterality: Right

## 2019-07-14 DIAGNOSIS — N390 Urinary tract infection, site not specified: Secondary | ICD-10-CM | POA: Diagnosis not present

## 2019-07-14 DIAGNOSIS — Z6828 Body mass index (BMI) 28.0-28.9, adult: Secondary | ICD-10-CM | POA: Diagnosis not present

## 2019-07-14 DIAGNOSIS — Z299 Encounter for prophylactic measures, unspecified: Secondary | ICD-10-CM | POA: Diagnosis not present

## 2019-07-14 DIAGNOSIS — I10 Essential (primary) hypertension: Secondary | ICD-10-CM | POA: Diagnosis not present

## 2019-07-14 DIAGNOSIS — E1151 Type 2 diabetes mellitus with diabetic peripheral angiopathy without gangrene: Secondary | ICD-10-CM | POA: Diagnosis not present

## 2019-07-14 DIAGNOSIS — J302 Other seasonal allergic rhinitis: Secondary | ICD-10-CM | POA: Diagnosis not present

## 2019-07-14 DIAGNOSIS — I739 Peripheral vascular disease, unspecified: Secondary | ICD-10-CM | POA: Diagnosis not present

## 2019-07-15 DIAGNOSIS — I10 Essential (primary) hypertension: Secondary | ICD-10-CM | POA: Diagnosis not present

## 2019-07-15 DIAGNOSIS — E119 Type 2 diabetes mellitus without complications: Secondary | ICD-10-CM | POA: Diagnosis not present

## 2019-07-21 DIAGNOSIS — Z299 Encounter for prophylactic measures, unspecified: Secondary | ICD-10-CM | POA: Diagnosis not present

## 2019-07-21 DIAGNOSIS — N390 Urinary tract infection, site not specified: Secondary | ICD-10-CM | POA: Diagnosis not present

## 2019-07-21 DIAGNOSIS — E1165 Type 2 diabetes mellitus with hyperglycemia: Secondary | ICD-10-CM | POA: Diagnosis not present

## 2019-09-04 DIAGNOSIS — I10 Essential (primary) hypertension: Secondary | ICD-10-CM | POA: Diagnosis not present

## 2019-09-04 DIAGNOSIS — E119 Type 2 diabetes mellitus without complications: Secondary | ICD-10-CM | POA: Diagnosis not present

## 2019-09-08 DIAGNOSIS — K5900 Constipation, unspecified: Secondary | ICD-10-CM | POA: Diagnosis not present

## 2019-09-08 DIAGNOSIS — K209 Esophagitis, unspecified without bleeding: Secondary | ICD-10-CM | POA: Diagnosis not present

## 2019-09-08 DIAGNOSIS — E1151 Type 2 diabetes mellitus with diabetic peripheral angiopathy without gangrene: Secondary | ICD-10-CM | POA: Diagnosis not present

## 2019-09-08 DIAGNOSIS — Z299 Encounter for prophylactic measures, unspecified: Secondary | ICD-10-CM | POA: Diagnosis not present

## 2019-09-08 DIAGNOSIS — E209 Hypoparathyroidism, unspecified: Secondary | ICD-10-CM | POA: Diagnosis not present

## 2019-10-26 DIAGNOSIS — I10 Essential (primary) hypertension: Secondary | ICD-10-CM | POA: Diagnosis not present

## 2019-10-26 DIAGNOSIS — E1165 Type 2 diabetes mellitus with hyperglycemia: Secondary | ICD-10-CM | POA: Diagnosis not present

## 2019-10-26 DIAGNOSIS — E209 Hypoparathyroidism, unspecified: Secondary | ICD-10-CM | POA: Diagnosis not present

## 2019-10-26 DIAGNOSIS — I739 Peripheral vascular disease, unspecified: Secondary | ICD-10-CM | POA: Diagnosis not present

## 2019-10-26 DIAGNOSIS — Z299 Encounter for prophylactic measures, unspecified: Secondary | ICD-10-CM | POA: Diagnosis not present

## 2019-10-26 DIAGNOSIS — E119 Type 2 diabetes mellitus without complications: Secondary | ICD-10-CM | POA: Diagnosis not present

## 2019-10-26 DIAGNOSIS — E7800 Pure hypercholesterolemia, unspecified: Secondary | ICD-10-CM | POA: Diagnosis not present

## 2019-11-04 DIAGNOSIS — E119 Type 2 diabetes mellitus without complications: Secondary | ICD-10-CM | POA: Diagnosis not present

## 2019-11-04 DIAGNOSIS — I10 Essential (primary) hypertension: Secondary | ICD-10-CM | POA: Diagnosis not present

## 2019-11-09 ENCOUNTER — Ambulatory Visit: Payer: Medicare PPO | Admitting: Gastroenterology

## 2019-11-09 ENCOUNTER — Encounter: Payer: Self-pay | Admitting: Gastroenterology

## 2019-11-09 ENCOUNTER — Other Ambulatory Visit: Payer: Self-pay

## 2019-11-09 VITALS — BP 160/80 | HR 86 | Temp 97.6°F | Ht 63.0 in | Wt 155.6 lb

## 2019-11-09 DIAGNOSIS — K219 Gastro-esophageal reflux disease without esophagitis: Secondary | ICD-10-CM

## 2019-11-09 DIAGNOSIS — R198 Other specified symptoms and signs involving the digestive system and abdomen: Secondary | ICD-10-CM

## 2019-11-09 DIAGNOSIS — R140 Abdominal distension (gaseous): Secondary | ICD-10-CM

## 2019-11-09 DIAGNOSIS — R911 Solitary pulmonary nodule: Secondary | ICD-10-CM | POA: Diagnosis not present

## 2019-11-09 NOTE — Patient Instructions (Signed)
1. When you complete your current probiotic, pick one with a different bacterial strain.  2. Continue omeprazole daily.  3. We will be in touch with further recommendations.

## 2019-11-09 NOTE — Progress Notes (Signed)
Primary Care Physician: Monico Blitz, MD  Primary Gastroenterologist:  Garfield Cornea, MD   Chief Complaint  Patient presents with  . Gastroesophageal Reflux    doing okay  . Abdominal Pain    none as of now    HPI: Michele Pace is a 79 y.o. female here for follow-up of reflux and abdominal pain.  Patient last seen in September 2019.  Complains of abdominal pain/bloating/gas back in April and May. Had come off omeprazole. Had tried some over the counter antacids. Couldn't function. Was so miserable to point of throwing up. Talked with friend who is a Marine scientist, [place] Melter, who recommend she see [place]. Started omeprazole 40mg  daily, gradually symptoms improved. Has been on since end of May.   Worried about "intestinal gas".  Feels bloated in the upper and lower abdomen.  Presses on bladder and just trickles. Having to take stool softners two daily, new problem for her.  He is to have daily bowel movement and then stools became less frequent.  BM daily and easy. Maybe a little more dense and have to drink more water. Tried Motegrity, but only had samples, seem to help. No melena or rectal bleeding..   Patient also has a history of right lower lobe lung nodule, is overdue for surveillance noncontrast chest CT (January 2020).    Current Outpatient Medications  Medication Sig Dispense Refill  . allopurinol (ZYLOPRIM) 100 MG tablet Take 100 mg by mouth daily.     Marland Kitchen amLODipine-benazepril (LOTREL) 5-20 MG per capsule Take 1 capsule by mouth daily.     Marland Kitchen Apoaequorin (PREVAGEN PO) Take by mouth daily.    Marland Kitchen aspirin 81 MG tablet Take 81 mg by mouth daily.    Marland Kitchen b complex vitamins tablet Take 1 tablet by mouth daily.    . calcium citrate (CALCITRATE - DOSED IN MG ELEMENTAL CALCIUM) 950 MG tablet Take 600 mg by mouth daily.    . Cholecalciferol (VITAMIN D3 PO) Take 25 mcg by mouth daily.    . Coenzyme Q10 (CO Q 10) 100 MG CAPS Take by mouth daily.    . Cranberry 500 MG TABS Take by mouth  daily.    . cyanocobalamin 1000 MCG tablet Take 2,500 mcg by mouth daily.     Marland Kitchen docusate sodium (STOOL SOFTENER) 100 MG capsule Take 200 mg by mouth daily.    . fluticasone (FLONASE) 50 MCG/ACT nasal spray Place 2 sprays into both nostrils daily.    Marland Kitchen loratadine (CLARITIN) 10 MG tablet Take 10 mg by mouth daily.    . Misc Natural Products (TART CHERRY ADVANCED PO) Take 1 tablet by mouth daily.    . multivitamin-iron-minerals-folic acid (CENTRUM) chewable tablet Chew 1 tablet by mouth daily.    Marland Kitchen omega-3 acid ethyl esters (LOVAZA) 1 G capsule Take 1 g by mouth daily.    Marland Kitchen omeprazole (PRILOSEC) 40 MG capsule Take 40 mg by mouth daily.     . pravastatin (PRAVACHOL) 20 MG tablet Take 20 mg by mouth daily.    . Probiotic Product (PROBIOTIC PO) Take by mouth every other day.    . psyllium (METAMUCIL) 58.6 % powder Take 1 packet by mouth daily.    Marland Kitchen pyridOXINE (VITAMIN B-6) 100 MG tablet Take 100 mg by mouth daily.    . vitamin C (ASCORBIC ACID) 500 MG tablet Take 1,000 mg by mouth daily.     . vitamin E 100 UNIT capsule Take 150 Units by mouth daily.    Marland Kitchen  zinc gluconate 50 MG tablet Take 50 mg by mouth daily.     No current facility-administered medications for this visit.    Allergies as of 11/09/2019 - Review Complete 11/09/2019  Allergen Reaction Noted  . Codeine Nausea And Vomiting    Past Medical History:  Diagnosis Date  . Diabetes ([place])    diet controlled  . GERD (gastroesophageal reflux disease)   . Gout   . HTN (hypertension)   . Hx of adenomatous colonic polyps   . Hypercholesteremia    Past Surgical History:  Procedure Laterality Date  . ABDOMINAL HYSTERECTOMY    . CATARACT EXTRACTION W/PHACO Left 06/14/2018   Procedure: CATARACT EXTRACTION PHACO AND INTRAOCULAR LENS PLACEMENT (IOC);  Surgeon: Baruch Goldmann, MD;  Location: [facility];  Service: Ophthalmology;  Laterality: Left;  CDE: 4.13  . COLONOSCOPY  10/25/07   RMR: normal rectum left sided diverticula  . COLONOSCOPY N/A  04/26/2014   Dr. Gala Romney: There are sigmoid diverticula, 3 mm polyp removed from the descending segment, tubular adenoma.  Next colonoscopy in 7 years  . ESOPHAGOGASTRODUODENOSCOPY  10/25/07   CBJ:SEGBTDVV esophagogasteic junction focal antral erosions and bular erosions, otherwise normal. mild chronic gastritis.   Marland Kitchen PARATHYROIDECTOMY  1999/2000   removed two  ' Family History  Problem Relation Age of Onset  . Colon cancer Paternal Aunt   . Heart disease Other    Social History   Socioeconomic History  . Marital status: Widowed    Spouse name: Not on file  . Number of children: Not on file  . Years of education: Not on file  . Highest education level: Not on file  Occupational History  . Occupation: retired Pharmacist, hospital  Tobacco Use  . Smoking status: Current Every Day Smoker    Packs/day: 0.25    Years: 25.00    Pack years: 6.25    Types: Cigarettes  . Smokeless tobacco: Former Systems developer    Quit date: 09/05/2008  Vaping Use  . Vaping Use: Never used  Substance and Sexual Activity  . Alcohol use: No    Alcohol/week: 0.0 standard drinks  . Drug use: No  . Sexual activity: Yes    Birth control/protection: Surgical  Other Topics Concern  . Not on file  Social History Narrative   Adopted daughter   Social Determinants of Health   Financial Resource Strain:   . Difficulty of Paying Living Expenses:   Food Insecurity:   . Worried About Charity fundraiser in the Last Year:   . Arboriculturist in the Last Year:   Transportation Needs:   . Film/video editor (Medical):   Marland Kitchen Lack of Transportation (Non-Medical):   Physical Activity:   . Days of Exercise per Week:   . Minutes of Exercise per Session:   Stress:   . Feeling of Stress :   Social Connections:   . Frequency of Communication with Friends and Family:   . Frequency of Social Gatherings with Friends and Family:   . Attends Religious Services:   . Active Member of Clubs or Organizations:   . Attends Archivist  Meetings:   Marland Kitchen Marital Status:     ROS:  General: Negative for anorexia, weight loss, fever, chills, fatigue, weakness. ENT: Negative for hoarseness, difficulty swallowing , nasal congestion. CV: Negative for chest pain, angina, palpitations, dyspnea on exertion, peripheral edema.  Respiratory: Negative for dyspnea at rest, dyspnea on exertion, cough, sputum, wheezing.  GI: See history of present illness. GU:  Negative for dysuria, hematuria, urinary incontinence, urinary frequency, nocturnal urination.  Endo: Negative for unusual weight change.    Physical Examination:   BP (!) 160/80 (BP Location: Right Arm, Cuff Size: Normal)   Pulse 86   Temp 97.6 F (36.4 C)   Ht 5\' 3"  (1.6 m)   Wt 155 lb 9.6 oz (70.6 kg)   BMI 27.56 kg/m   General: Well-nourished, well-developed in no acute distress.  Eyes: No icterus. Mouth: masked Lungs: Clear to auscultation bilaterally.  Heart: Regular rate and rhythm, no murmurs rubs or gallops.  Abdomen: Bowel sounds are normal, nontender, nondistended, no hepatosplenomegaly or masses, no abdominal bruits or hernia , no rebound or guarding.   Extremities: No lower extremity edema. No clubbing or deformities. Neuro: Alert and oriented x 4   Skin: Warm and dry, no jaundice.   Psych: Alert and cooperative, normal mood and affect.    Impression/plan:  Pleasant 79 year old female presenting for further evaluation of abdominal pain associated with bloating/gas, reflux and vomiting.  Also with noted change in bowel habits.  She empirically started omeprazole 40 mg daily several months back which is helped with reflux and vomiting.  Continues to have issues with bloating and gas.  Complains of a lot of belching.  Feels bloated and full in the lower abdomen to the point that she feels like it is affecting her urination and bowel movements.  Remote EGD 2009 with chronic gastritis.  Last colonoscopy 2016 with a 3 mm tubular adenoma removed, diverticulosis.   Next colonoscopy planned for 2023.  To discuss further with Dr. Gala Romney.  Potentially consider upper endoscopy to evaluate upper GI symptoms although this would not evaluate her lower abdominal bloating/pain complaints.  She is also due for surveillance chest CT, noncontrast, due to pulmonary nodule.  Plans to optimize initial work-up.  Further recommendations to follow.  Continue omeprazole 40 mg daily for now.

## 2019-11-14 ENCOUNTER — Encounter: Payer: Self-pay | Admitting: Gastroenterology

## 2019-12-01 ENCOUNTER — Telehealth: Payer: Self-pay | Admitting: Gastroenterology

## 2019-12-01 DIAGNOSIS — E119 Type 2 diabetes mellitus without complications: Secondary | ICD-10-CM | POA: Diagnosis not present

## 2019-12-01 DIAGNOSIS — R197 Diarrhea, unspecified: Secondary | ICD-10-CM

## 2019-12-01 DIAGNOSIS — R198 Other specified symptoms and signs involving the digestive system and abdomen: Secondary | ICD-10-CM

## 2019-12-01 DIAGNOSIS — Z961 Presence of intraocular lens: Secondary | ICD-10-CM | POA: Diagnosis not present

## 2019-12-01 DIAGNOSIS — H2511 Age-related nuclear cataract, right eye: Secondary | ICD-10-CM | POA: Diagnosis not present

## 2019-12-01 DIAGNOSIS — R140 Abdominal distension (gaseous): Secondary | ICD-10-CM

## 2019-12-01 DIAGNOSIS — R109 Unspecified abdominal pain: Secondary | ICD-10-CM

## 2019-12-01 DIAGNOSIS — R911 Solitary pulmonary nodule: Secondary | ICD-10-CM

## 2019-12-01 DIAGNOSIS — H524 Presbyopia: Secondary | ICD-10-CM | POA: Diagnosis not present

## 2019-12-01 NOTE — Telephone Encounter (Signed)
Please let pt know that Dr. Gala Romney is recommending the following assuming she is still having symptoms that were present at time of OV.  CT chest: noncontrast to follow up pulmonary nodule right lower lobe.  CT A/P with contrast at time of chest ct: abd pain, bloating, vomiting, change in bowels, rule out diverticulitis.  She will need labs: CMET, CBC, lipase. If having urinary symptoms also get U/A with culture reflux (for dysuria).

## 2019-12-01 NOTE — Telephone Encounter (Signed)
Lmom, waiting on a return call.  

## 2019-12-02 ENCOUNTER — Other Ambulatory Visit: Payer: Self-pay

## 2019-12-02 DIAGNOSIS — Z79899 Other long term (current) drug therapy: Secondary | ICD-10-CM

## 2019-12-02 DIAGNOSIS — R300 Dysuria: Secondary | ICD-10-CM

## 2019-12-02 NOTE — Telephone Encounter (Signed)
Spoke with pt. Pt was notified of Dr. Coralee North recommendations for current symptoms. Lab orders and u/a placed. Please schedule CT scan.

## 2019-12-06 NOTE — Telephone Encounter (Signed)
PA for CT chest and abd/pelvis submitted via [employer] website.  CT chest approved. [employer]# [id number]. CT abd/pelvis went to clinical review. Clinical notes uploaded. Tracking# [id number].

## 2019-12-06 NOTE — Addendum Note (Signed)
Addended by: Hassan Rowan on: 12/06/2019 03:48 PM   Modules accepted: Orders

## 2019-12-06 NOTE — Telephone Encounter (Signed)
CT chest, abd/pelvis scheduled for 01/02/20 at 10:00am, arrive at 9:45am. NPO 4 hours prior to test. Pickup contrast before day of test.  Tried to call pt to inform her of CT appt, no answer. Appt letter mailed.

## 2019-12-07 DIAGNOSIS — R300 Dysuria: Secondary | ICD-10-CM | POA: Diagnosis not present

## 2019-12-07 DIAGNOSIS — Z79899 Other long term (current) drug therapy: Secondary | ICD-10-CM | POA: Diagnosis not present

## 2019-12-07 NOTE — Telephone Encounter (Signed)
CT abd/pelvis approved. [employer]# 01/02/20-02/01/20.

## 2019-12-09 LAB — CBC WITH DIFFERENTIAL/PLATELET
Absolute Monocytes: 514 cells/uL (ref 200–950)
Basophils Absolute: 75 cells/uL (ref 0–200)
Basophils Relative: 0.7 %
Eosinophils Absolute: 86 cells/uL (ref 15–500)
Eosinophils Relative: 0.8 %
HCT: 46.8 % — ABNORMAL HIGH (ref 35.0–45.0)
Hemoglobin: 16.1 g/dL — ABNORMAL HIGH (ref 11.7–15.5)
Lymphs Abs: 3092 cells/uL (ref 850–3900)
MCH: 32.1 pg (ref 27.0–33.0)
MCHC: 34.4 g/dL (ref 32.0–36.0)
MCV: 93.4 fL (ref 80.0–100.0)
MPV: 12.5 fL (ref 7.5–12.5)
Monocytes Relative: 4.8 %
Neutro Abs: 6934 cells/uL (ref 1500–7800)
Neutrophils Relative %: 64.8 %
Platelets: 260 10*3/uL (ref 140–400)
RBC: 5.01 10*6/uL (ref 3.80–5.10)
RDW: 13 % (ref 11.0–15.0)
Total Lymphocyte: 28.9 %
WBC: 10.7 10*3/uL (ref 3.8–10.8)

## 2019-12-09 LAB — URINALYSIS W MICROSCOPIC + REFLEX CULTURE
Bacteria, UA: NONE SEEN /HPF
Bilirubin Urine: NEGATIVE
Glucose, UA: NEGATIVE
Hgb urine dipstick: NEGATIVE
Hyaline Cast: NONE SEEN /LPF
Ketones, ur: NEGATIVE
Nitrites, Initial: NEGATIVE
Protein, ur: NEGATIVE
RBC / HPF: NONE SEEN /HPF (ref 0–2)
Specific Gravity, Urine: 1.006 (ref 1.001–1.03)
WBC, UA: NONE SEEN /HPF (ref 0–5)
pH: 6 (ref 5.0–8.0)

## 2019-12-09 LAB — COMPREHENSIVE METABOLIC PANEL
AG Ratio: 1.9 (calc) (ref 1.0–2.5)
ALT: 22 U/L (ref 6–29)
AST: 16 U/L (ref 10–35)
Albumin: 4.7 g/dL (ref 3.6–5.1)
Alkaline phosphatase (APISO): 85 U/L (ref 37–153)
BUN: 16 mg/dL (ref 7–25)
CO2: 28 mmol/L (ref 20–32)
Calcium: 11.1 mg/dL — ABNORMAL HIGH (ref 8.6–10.4)
Chloride: 100 mmol/L (ref 98–110)
Creat: 0.74 mg/dL (ref 0.60–0.93)
Globulin: 2.5 g/dL (calc) (ref 1.9–3.7)
Glucose, Bld: 106 mg/dL (ref 65–139)
Potassium: 4.6 mmol/L (ref 3.5–5.3)
Sodium: 138 mmol/L (ref 135–146)
Total Bilirubin: 0.5 mg/dL (ref 0.2–1.2)
Total Protein: 7.2 g/dL (ref 6.1–8.1)

## 2019-12-09 LAB — URINE CULTURE
MICRO NUMBER:: 10903662
SPECIMEN QUALITY:: ADEQUATE

## 2019-12-09 LAB — LIPASE: Lipase: 268 U/L — ABNORMAL HIGH (ref 7–60)

## 2019-12-09 LAB — CULTURE INDICATED

## 2019-12-27 ENCOUNTER — Telehealth: Payer: Self-pay | Admitting: Gastroenterology

## 2019-12-27 NOTE — Telephone Encounter (Signed)
Received message from CT asking if they can scan chest/abd/pelvis all at once to reduce radiation exposure. Can be done if we allow Chest CT to have contrast. It was ordered without contrast and pre-auth is for without contrast.   OK to pursue CT chest with contrast to reduce radiation exposure since CT tech states same information can be obtained with contrast.   PLEASE VERIFY THERE WILL BE NO PRE-AUTH ISSUES IF ORDER IS CHANGED TO CHEST CT WITH CONTRAST.   pLEASE LET CT KNOW WHEN INFORMATION IS AVAILABLE.  SHE IS CURRENT ON SCHEDULE FOR 9/27.

## 2019-12-27 NOTE — Telephone Encounter (Signed)
Auth on file will cover CT chest w/ contrast as well. Fax is scanned into chart under media.   called Michele Pace in [place] and made aware.

## 2019-12-30 DIAGNOSIS — E559 Vitamin D deficiency, unspecified: Secondary | ICD-10-CM | POA: Diagnosis not present

## 2019-12-30 DIAGNOSIS — R5383 Other fatigue: Secondary | ICD-10-CM | POA: Diagnosis not present

## 2019-12-30 DIAGNOSIS — Z79899 Other long term (current) drug therapy: Secondary | ICD-10-CM | POA: Diagnosis not present

## 2019-12-30 DIAGNOSIS — E7800 Pure hypercholesterolemia, unspecified: Secondary | ICD-10-CM | POA: Diagnosis not present

## 2019-12-30 DIAGNOSIS — I10 Essential (primary) hypertension: Secondary | ICD-10-CM | POA: Diagnosis not present

## 2019-12-30 DIAGNOSIS — Z6827 Body mass index (BMI) 27.0-27.9, adult: Secondary | ICD-10-CM | POA: Diagnosis not present

## 2019-12-30 DIAGNOSIS — E209 Hypoparathyroidism, unspecified: Secondary | ICD-10-CM | POA: Diagnosis not present

## 2019-12-30 DIAGNOSIS — Z1331 Encounter for screening for depression: Secondary | ICD-10-CM | POA: Diagnosis not present

## 2019-12-30 DIAGNOSIS — Z7189 Other specified counseling: Secondary | ICD-10-CM | POA: Diagnosis not present

## 2019-12-30 DIAGNOSIS — Z0000 Encounter for general adult medical examination without abnormal findings: Secondary | ICD-10-CM | POA: Diagnosis not present

## 2019-12-30 DIAGNOSIS — Z1339 Encounter for screening examination for other mental health and behavioral disorders: Secondary | ICD-10-CM | POA: Diagnosis not present

## 2019-12-30 DIAGNOSIS — Z299 Encounter for prophylactic measures, unspecified: Secondary | ICD-10-CM | POA: Diagnosis not present

## 2020-01-02 ENCOUNTER — Ambulatory Visit (HOSPITAL_COMMUNITY)
Admission: RE | Admit: 2020-01-02 | Discharge: 2020-01-02 | Disposition: A | Discharge status: Home / Self Care | Payer: Medicare PPO | Source: Ambulatory Visit | Attending: Gastroenterology | Admitting: Gastroenterology

## 2020-01-02 ENCOUNTER — Other Ambulatory Visit: Payer: Self-pay

## 2020-01-02 ENCOUNTER — Telehealth: Payer: Self-pay

## 2020-01-02 DIAGNOSIS — I700 Atherosclerosis of aorta: Secondary | ICD-10-CM | POA: Diagnosis not present

## 2020-01-02 DIAGNOSIS — R140 Abdominal distension (gaseous): Secondary | ICD-10-CM

## 2020-01-02 DIAGNOSIS — K7689 Other specified diseases of liver: Secondary | ICD-10-CM | POA: Diagnosis not present

## 2020-01-02 DIAGNOSIS — R194 Change in bowel habit: Secondary | ICD-10-CM | POA: Diagnosis not present

## 2020-01-02 DIAGNOSIS — R197 Diarrhea, unspecified: Secondary | ICD-10-CM | POA: Insufficient documentation

## 2020-01-02 DIAGNOSIS — R198 Other specified symptoms and signs involving the digestive system and abdomen: Secondary | ICD-10-CM | POA: Diagnosis not present

## 2020-01-02 DIAGNOSIS — R109 Unspecified abdominal pain: Secondary | ICD-10-CM | POA: Insufficient documentation

## 2020-01-02 DIAGNOSIS — K5730 Diverticulosis of large intestine without perforation or abscess without bleeding: Secondary | ICD-10-CM | POA: Diagnosis not present

## 2020-01-02 DIAGNOSIS — I2510 Atherosclerotic heart disease of native coronary artery without angina pectoris: Secondary | ICD-10-CM | POA: Diagnosis not present

## 2020-01-02 DIAGNOSIS — R911 Solitary pulmonary nodule: Secondary | ICD-10-CM | POA: Diagnosis not present

## 2020-01-02 MED ORDER — IOHEXOL 300 MG/ML  SOLN
100.0000 mL | Freq: Once | INTRAMUSCULAR | Status: AC | PRN
  Administered 2020-01-02: 100 mL via INTRAVENOUS

## 2020-01-02 NOTE — Telephone Encounter (Signed)
Received a call from [facility] radiology. CT impression for CT scan completed today. IMPRESSION: 1. Stable RIGHT lower lobe pulmonary nodule over 2 year interval consistent benign pulmonary nodule. Additional nodule in the RIGHT lower lobe was not imaged on comparison CT abdomen. No follow-up needed if patient is low-risk. Non-contrast chest CT can be considered in 12 months if patient is high-risk. This recommendation follows the consensus statement: Guidelines for Management of Incidental Pulmonary Nodules Detected on CT Images: From the Fleischner Society 2017; Radiology 2017; 284:228-243. 2. New pancreatic duct dilatation is favored to represent present benign dilatation related to enlarging pancreatic head calcification. However, RECOMMEND MRI pancreatic protocol with contrast to evaluate for occult obstructing pancreatic neoplasm. 3. Several small early enhancing lesions in the RIGHT hepatic lobe are favored benign vascular phenomena. Recommend contrast MRI of the liver to coincide with MRI of the pancreas recommended above. 4. Moderate volume stool in the rectosigmoid colon could indicate mild constipation. 5. Sigmoid diverticulosis without evidence of acute diverticulitis. 6. Aortic Atherosclerosis (ICD10-I70.0).  Spoke with Neil Crouch, PA she is aware of results and will address.

## 2020-01-02 NOTE — Telephone Encounter (Signed)
See result note.  

## 2020-01-04 ENCOUNTER — Telehealth: Payer: Self-pay | Admitting: *Deleted

## 2020-01-04 ENCOUNTER — Other Ambulatory Visit: Payer: Self-pay | Admitting: *Deleted

## 2020-01-04 ENCOUNTER — Encounter: Payer: Self-pay | Admitting: *Deleted

## 2020-01-04 DIAGNOSIS — K8689 Other specified diseases of pancreas: Secondary | ICD-10-CM

## 2020-01-04 DIAGNOSIS — R9389 Abnormal findings on diagnostic imaging of other specified body structures: Secondary | ICD-10-CM

## 2020-01-04 DIAGNOSIS — K769 Liver disease, unspecified: Secondary | ICD-10-CM

## 2020-01-04 NOTE — Telephone Encounter (Signed)
PA pending via [facility] for MRI abd. Tracking# [id number]. Records faxed

## 2020-01-05 DIAGNOSIS — I10 Essential (primary) hypertension: Secondary | ICD-10-CM | POA: Diagnosis not present

## 2020-01-05 DIAGNOSIS — E119 Type 2 diabetes mellitus without complications: Secondary | ICD-10-CM | POA: Diagnosis not present

## 2020-01-05 NOTE — Telephone Encounter (Signed)
PA approved. Auth# [id number] dates 01/16/2020-02/15/2020

## 2020-01-16 ENCOUNTER — Other Ambulatory Visit: Payer: Self-pay | Admitting: Gastroenterology

## 2020-01-16 ENCOUNTER — Other Ambulatory Visit: Payer: Self-pay

## 2020-01-16 ENCOUNTER — Ambulatory Visit (HOSPITAL_COMMUNITY)
Admission: RE | Admit: 2020-01-16 | Discharge: 2020-01-16 | Disposition: A | Discharge status: Home / Self Care | Payer: Medicare PPO | Source: Ambulatory Visit | Attending: Gastroenterology | Admitting: Gastroenterology

## 2020-01-16 DIAGNOSIS — R9389 Abnormal findings on diagnostic imaging of other specified body structures: Secondary | ICD-10-CM

## 2020-01-16 DIAGNOSIS — K8689 Other specified diseases of pancreas: Secondary | ICD-10-CM

## 2020-01-16 DIAGNOSIS — K769 Liver disease, unspecified: Secondary | ICD-10-CM

## 2020-01-16 DIAGNOSIS — K7689 Other specified diseases of liver: Secondary | ICD-10-CM | POA: Diagnosis not present

## 2020-01-16 DIAGNOSIS — N281 Cyst of kidney, acquired: Secondary | ICD-10-CM | POA: Diagnosis not present

## 2020-01-16 MED ORDER — GADOBUTROL 1 MMOL/ML IV SOLN
7.0000 mL | Freq: Once | INTRAVENOUS | Status: AC | PRN
  Administered 2020-01-16: 7 mL via INTRAVENOUS

## 2020-01-26 ENCOUNTER — Other Ambulatory Visit: Payer: Self-pay

## 2020-01-26 MED ORDER — PANCRELIPASE (LIP-PROT-AMYL) 36000-114000 UNITS PO CPEP: ORAL_CAPSULE | ORAL | 2 refills | Status: DC

## 2020-01-27 ENCOUNTER — Telehealth: Payer: Self-pay

## 2020-01-27 NOTE — Telephone Encounter (Signed)
03/20/20 appt made with Dr Manfred Shirts.  The pt has been advised and given address and phone number of our office.

## 2020-01-27 NOTE — Telephone Encounter (Signed)
Thanks Patty! 

## 2020-01-27 NOTE — Telephone Encounter (Signed)
----- Message from Irving Copas., MD sent at 01/26/2020  5:28 PM EDT ----- Regarding: Follow-up in [place] Thanks to all.Letoya Stallone, please move forward with scheduling this patient a clinic visit so we can discuss potential EUS plus/minus ERCP needs.Please let PA Lewis and Dr. Gala Romney and I know when patient is scheduled for clinic visit.Thanks.GM ----- Message ----- From: Daneil Dolin, MD Sent: 01/26/2020   2:56 PM EDT To: Mahala Menghini, PA-C, #  Plan sounds good.  Thanks to all for the help.  ----- Message ----- From: Westly Pam Sent: 01/26/2020   1:45 PM EDT To: Daneil Dolin, MD, Irving Copas., MD  Thanks for the input! We have just started patient on Creon. Let's have patient meet with you first in the office and then you can pursue appropriate procedures as you see fit. We will let patient know. Thanks again!  Magda Paganini ----- Message ----- From: Irving Copas., MD Sent: 01/26/2020   8:29 AM EDT To: Milus Banister, MD, Daneil Dolin, MD, #  LL,Thanks for reaching out to [place].Interesting case especially in the setting of not having previous pancreatitis based on your clinic note.The progressive duct dilation may well be a result of the reported pancreatic duct stones.  In some individuals improvement in pain can be found by trying to alleviate the duct obstruction either with stenting or trying to remove the stone itself.There is some data as well that patients were on PERT at least 72,000 units of lipase with each meal may have some benefit in chronic pancreatitis pain as well so that is something else that could be considered.I would be happy to meet the patient, also talk with him about undergoing an EUS and potential ERCP to just better define and ensure that we are not missing an underlying mass or lesion which is unlikely but just to confirm the stone.I would then only consider ERCP if patient had persistent pain and we confirmed that it was only  a stone.If the patient wants to have an EUS first then we can move forward with that without me seeing him and subsequently can talk with him about pancreatic ERCP thereafter.Please let me know if you would like [place] to move forward with scheduling the EUS and a clinic visit.Thanks.GM ----- Message ----- From: Milus Banister, MD Sent: 01/25/2020  12:21 PM EDT To: Daneil Dolin, MD, Mahala Menghini, PA-C, #  I try never to intubate the pancreas if at all possible during ERCPs.  I will forward this to Dr. Rush Landmark who has more experience and expertise with pancreatic therapeutics  Thanks  Gabe, FYI see below.  ----- Message ----- From: Westly Pam Sent: 01/25/2020  12:09 PM EDT To: Milus Banister, MD, Mahala Menghini, PA-C  Hi. This 79 y/o female has an abnormal pancreas on recent CT and MRI. Complains of abdominal pain for several months. See MRI impression below. Lipase 268.  IMPRESSION: 1. No definite abnormal parenchymal hypoenhancement to suggest adenocarcinoma. 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 Consider surveillance pancreatic imaging by CT or MRI in 6-12 months time.  Dr. Gala Romney would like your opinion regarding possible EUS/ERCP with ?PD stent placement as imaging suggesting dominant calcification in the head  of the pancreas is impinging on the dorsal pancreatic duct producing upstream dilation, which will likely not get better with time.  I would appreciate any input!  Magda Paganini

## 2020-02-02 ENCOUNTER — Ambulatory Visit: Payer: Self-pay | Attending: Internal Medicine

## 2020-02-02 DIAGNOSIS — Z961 Presence of intraocular lens: Secondary | ICD-10-CM | POA: Diagnosis not present

## 2020-02-02 DIAGNOSIS — H02834 Dermatochalasis of left upper eyelid: Secondary | ICD-10-CM | POA: Diagnosis not present

## 2020-02-02 DIAGNOSIS — H01002 Unspecified blepharitis right lower eyelid: Secondary | ICD-10-CM | POA: Diagnosis not present

## 2020-02-02 DIAGNOSIS — H25811 Combined forms of age-related cataract, right eye: Secondary | ICD-10-CM | POA: Diagnosis not present

## 2020-02-02 DIAGNOSIS — H01005 Unspecified blepharitis left lower eyelid: Secondary | ICD-10-CM | POA: Diagnosis not present

## 2020-02-02 DIAGNOSIS — H01001 Unspecified blepharitis right upper eyelid: Secondary | ICD-10-CM | POA: Diagnosis not present

## 2020-02-02 DIAGNOSIS — H01004 Unspecified blepharitis left upper eyelid: Secondary | ICD-10-CM | POA: Diagnosis not present

## 2020-02-02 DIAGNOSIS — H02831 Dermatochalasis of right upper eyelid: Secondary | ICD-10-CM | POA: Diagnosis not present

## 2020-02-02 DIAGNOSIS — Z23 Encounter for immunization: Secondary | ICD-10-CM

## 2020-02-02 NOTE — Progress Notes (Signed)
   Covid-19 Vaccination Clinic  Name:  Michele Pace    MRN: [id number] DOB: October 23, 1940  02/02/2020  Michele Pace was observed post Covid-19 immunization for 15 minutes without incident. She was provided with Vaccine Information Sheet and instruction to access the V-Safe system.   Michele Pace was instructed to call 911 with any severe reactions post vaccine: Marland Kitchen Difficulty breathing  . Swelling of face and throat  . A fast heartbeat  . A bad rash all over body  . Dizziness and weakness

## 2020-02-03 DIAGNOSIS — I10 Essential (primary) hypertension: Secondary | ICD-10-CM | POA: Diagnosis not present

## 2020-02-03 DIAGNOSIS — E119 Type 2 diabetes mellitus without complications: Secondary | ICD-10-CM | POA: Diagnosis not present

## 2020-02-07 DIAGNOSIS — E8940 Asymptomatic postprocedural ovarian failure: Secondary | ICD-10-CM | POA: Diagnosis not present

## 2020-02-07 DIAGNOSIS — M818 Other osteoporosis without current pathological fracture: Secondary | ICD-10-CM | POA: Diagnosis not present

## 2020-02-28 NOTE — H&P (Signed)
Surgical History & Physical  Patient Name: Michele Pace DOB: Mar 24, 1941  Surgery: Cataract extraction with intraocular lens implant phacoemulsification; Right Eye  Surgeon: Baruch Goldmann MD Surgery Date:  03/19/2020 Pre-Op Date:  02/28/2020  HPI: A 62 Yr. old female patient 1. 1. The patient complains of difficulty when seeing street signs, which began 6 months ago. The right eye is affected. This is negatively affecting the patient's quality of life. The episode is gradual. The patient describes foggy, glare and hazy symptoms affecting their eyes/vision. The condition's severity is worsening. The condition is worse with daily activities. Distance vision is worse than near. The complaint is associated with blurry vision. The patient experiences no flashes, floater, shadow, curtain or veil and no visual phenomenon. HPI was performed by Baruch Goldmann .  Medical History: Cataracts Diabetes - DM Type 2 High Blood Pressure LDL gastrointestinal issues  Review of Systems Negative Allergic/Immunologic Negative Cardiovascular Negative Constitutional Negative Ear, Nose, Mouth & Throat Negative Endocrine Negative Gastrointestinal Negative Genitourinary Negative Hemotologic/Lymphatic Negative Integumentary Negative Musculoskeletal Negative Neurological Negative Psychiatry Negative Respiratory  Social   Current some day smoker   Medication PRAVASTATIN SODIUM, ALLOPURINOL, Creon,   Sx/Procedures Phaco c IOL,  Hysterectomy, Thyroid,   Drug Allergies  Codeine,   History & Physical: Heent:  Cataract, Right eye NECK: supple without bruits LUNGS: lungs clear to auscultation CV: regular rate and rhythm Abdomen: soft and non-tender  Impression & Plan: Assessment: 1.  COMBINED FORMS AGE RELATED CATARACT; Right Eye (H25.811) 2.  INTRAOCULAR LENS IOL ; Left Eye (Z96.1) 3.  BLEPHARITIS; Right Upper Lid, Right Lower Lid, Left Upper Lid, Left Lower Lid (H01.001,  H01.002,H01.004,H01.005) 4.  DERMATOCHALASIS, no surgery; Right Upper Lid, Left Upper Lid (H02.831, H02.834) 5.  Diabetes Type 2 No retinopathy (E11.9)  Plan: 1.  Cataract not visually significant - continue to monitor. Cataract accounts for the patient's decreased vision. This visual impairment is not correctable with a tolerable change in glasses or contact lenses. Cataract surgery with an implantation of a new lens should significantly improve the visual and functional status of the patient. Discussed all risks, benefits, alternatives, and potential complications. Discussed the procedures and recovery. Patient desires to have surgery. A-scan ordered and performed today for intra-ocular lens calculations. The surgery will be performed in order to improve vision for driving, reading, and for eye examinations. Recommend phacoemulsification with intra-ocular lens. Recommend Dextenza for post-operative pain and inflammation. Surgery required to correct imbalance of vision. Dilates well - shugarcaine by protocol. Right Eye. 2.  Stable. Doing well since surgery 3.  Begin/continue lid scrubs. 4.  Asymptomatic, recommend observation for now. Findings, prognosis and treatment options reviewed. 5.  Stressed importance of blood sugar and blood pressure control, and also yearly eye examinations. Discussed the need for ongoing proactive ocular exams and treatment, hopefully before visual symptoms develop.

## 2020-03-06 DIAGNOSIS — I10 Essential (primary) hypertension: Secondary | ICD-10-CM | POA: Diagnosis not present

## 2020-03-06 DIAGNOSIS — E119 Type 2 diabetes mellitus without complications: Secondary | ICD-10-CM | POA: Diagnosis not present

## 2020-03-09 NOTE — Patient Instructions (Signed)
Michele Pace  03/09/2020     @PREFPERIOPPHARMACY @   Your procedure is scheduled on  03/19/2020.  Report to [facility] at  [phone]  A.M.  Call this number if you have problems the morning of surgery:  [phone]   Remember:  Do not eat or drink after midnight.                        Take these medicines the morning of surgery with A SIP OF WATER  Allopurinol, amlodipine, claritin, prilosec.    Do not wear jewelry, make-up or nail polish.  Do not wear lotions, powders, or perfumes. Please wear deodorant and brush your teeth.  Do not shave 48 hours prior to surgery.  Men may shave face and neck.  Do not bring valuables to the hospital.  [facility] is not responsible for any belongings or valuables.  Contacts, dentures or bridgework may not be worn into surgery.  Leave your suitcase in the car.  After surgery it may be brought to your room.  For patients admitted to the hospital, discharge time will be determined by your treatment team.  Patients discharged the day of surgery will not be allowed to drive home.   Name and phone number of your driver:   family Special instructions:   DO NOT smoke the morning of your procedure.  Please read over the following fact sheets that you were given. Anesthesia Post-op Instructions and Care and Recovery After Surgery      Cataract Surgery, Care After This sheet gives you information about how to care for yourself after your procedure. Your health care provider may also give you more specific instructions. If you have problems or questions, contact your health care provider. What can I expect after the procedure? After the procedure, it is common to have:  Itching.  Discomfort.  Fluid discharge.  Sensitivity to light and to touch.  Bruising in or around the eye.  Mild blurred vision. Follow these instructions at home: Eye care   Do not touch or rub your eyes.  Protect your eyes as told by your health care provider. You  may be told to wear a protective eye shield or sunglasses.  Do not put a contact lens into the affected eye or eyes until your health care provider approves.  Keep the area around your eye clean and dry: ? Avoid swimming. ? Do not allow water to hit you directly in the face while showering. ? Keep soap and shampoo out of your eyes.  Check your eye every day for signs of infection. Watch for: ? Redness, swelling, or pain. ? Fluid, blood, or pus. ? Warmth. ? A bad smell. ? Vision that is getting worse. ? Sensitivity that is getting worse. Activity  Do not drive for 24 hours if you were given a sedative during your procedure.  Avoid strenuous activities, such as playing contact sports, for as long as told by your health care provider.  Do not drive or use heavy machinery until your health care provider approves.  Do not bend or lift heavy objects. Bending increases pressure in the eye. You can walk, climb stairs, and do light household chores.  Ask your health care provider when you can return to work. If you work in a dusty environment, you may be advised to wear protective eyewear for a period of time. General instructions  Take or apply over-the-counter and prescription medicines only as  told by your health care provider. This includes eye drops.  Keep all follow-up visits as told by your health care provider. This is important. Contact a health care provider if:  You have increased bruising around your eye.  You have pain that is not helped with medicine.  You have a fever.  You have redness, swelling, or pain in your eye.  You have fluid, blood, or pus coming from your incision.  Your vision gets worse.  Your sensitivity to light gets worse. Get help right away if:  You have sudden loss of vision.  You see flashes of light or spots (floaters).  You have severe eye pain.  You develop nausea or vomiting. Summary  After your procedure, it is common to have  itching, discomfort, bruising, fluid discharge, or sensitivity to light.  Follow instructions from your health care provider about caring for your eye after the procedure.  Do not rub your eye after the procedure. You may need to wear eye protection or sunglasses. Do not wear contact lenses. Keep the area around your eye clean and dry.  Avoid activities that require a lot of effort. These include playing sports and lifting heavy objects.  Contact a health care provider if you have increased bruising, pain that does not go away, or a fever. Get help right away if you suddenly lose your vision, see flashes of light or spots, or have severe pain in the eye. This information is not intended to replace advice given to you by your health care provider. Make sure you discuss any questions you have with your health care provider. Document Revised: 01/18/2019 Document Reviewed: 09/21/2017 Elsevier Patient Education  [employer].

## 2020-03-12 DIAGNOSIS — H25811 Combined forms of age-related cataract, right eye: Secondary | ICD-10-CM | POA: Diagnosis not present

## 2020-03-15 ENCOUNTER — Other Ambulatory Visit (HOSPITAL_COMMUNITY)
Admission: RE | Admit: 2020-03-15 | Discharge: 2020-03-15 | Disposition: A | Discharge status: Home / Self Care | Payer: Medicare PPO | Source: Ambulatory Visit | Attending: Ophthalmology | Admitting: Ophthalmology

## 2020-03-15 ENCOUNTER — Encounter (HOSPITAL_COMMUNITY): Payer: Self-pay

## 2020-03-15 ENCOUNTER — Encounter (HOSPITAL_COMMUNITY)
Admission: RE | Admit: 2020-03-15 | Discharge: 2020-03-15 | Disposition: A | Discharge status: Home / Self Care | Payer: Medicare PPO | Source: Ambulatory Visit | Attending: Ophthalmology | Admitting: Ophthalmology

## 2020-03-15 ENCOUNTER — Other Ambulatory Visit: Payer: Self-pay

## 2020-03-15 DIAGNOSIS — Z01818 Encounter for other preprocedural examination: Secondary | ICD-10-CM | POA: Diagnosis not present

## 2020-03-15 DIAGNOSIS — Z20822 Contact with and (suspected) exposure to covid-19: Secondary | ICD-10-CM | POA: Diagnosis not present

## 2020-03-15 LAB — BASIC METABOLIC PANEL
Anion gap: 8 (ref 5–15)
BUN: 13 mg/dL (ref 8–23)
CO2: 25 mmol/L (ref 22–32)
Calcium: 10.3 mg/dL (ref 8.9–10.3)
Chloride: 102 mmol/L (ref 98–111)
Creatinine, Ser: 0.62 mg/dL (ref 0.44–1.00)
GFR, Estimated: >60 mL/min (ref >60)
Glucose, Bld: 107 mg/dL — ABNORMAL HIGH (ref 70–99)
Potassium: 4.2 mmol/L (ref 3.5–5.1)
Sodium: 135 mmol/L (ref 135–145)

## 2020-03-15 LAB — HEMOGLOBIN A1C
Hgb A1c MFr Bld: 6.4 % — ABNORMAL HIGH (ref 4.8–5.6)
Mean Plasma Glucose: 136.98 mg/dL

## 2020-03-15 LAB — SARS CORONAVIRUS 2 (TAT 6-24 HRS): SARS Coronavirus 2: NEGATIVE

## 2020-03-16 MED ORDER — LIDOCAINE HCL (PF) 1 % IJ SOLN
INTRAMUSCULAR | Status: AC
  Filled 2020-03-16: qty 2

## 2020-03-16 MED ORDER — NEOMYCIN-POLYMYXIN-DEXAMETH 3.5-10000-0.1 OP SUSP
OPHTHALMIC | Status: AC
  Filled 2020-03-16: qty 5

## 2020-03-16 MED ORDER — LIDOCAINE HCL 3.5 % OP GEL
OPHTHALMIC | Status: AC
  Filled 2020-03-16: qty 1

## 2020-03-16 MED ORDER — PHENYLEPHRINE HCL 2.5 % OP SOLN
OPHTHALMIC | Status: AC
  Filled 2020-03-16: qty 15

## 2020-03-16 MED ORDER — TETRACAINE HCL 0.5 % OP SOLN
OPHTHALMIC | Status: AC
  Filled 2020-03-16: qty 4

## 2020-03-16 MED ORDER — CYCLOPENTOLATE-PHENYLEPHRINE 0.2-1 % OP SOLN
OPHTHALMIC | Status: AC
  Filled 2020-03-16: qty 2

## 2020-03-19 ENCOUNTER — Ambulatory Visit (HOSPITAL_COMMUNITY)
Admission: RE | Admit: 2020-03-19 | Discharge: 2020-03-19 | Disposition: A | Discharge status: Home / Self Care | Payer: Medicare PPO | Attending: Ophthalmology | Admitting: Ophthalmology

## 2020-03-19 ENCOUNTER — Encounter (HOSPITAL_COMMUNITY): Payer: Self-pay | Admitting: Ophthalmology

## 2020-03-19 ENCOUNTER — Encounter (HOSPITAL_COMMUNITY)
Admission: RE | Disposition: A | Discharge status: Home / Self Care | Payer: Self-pay | Source: Home / Self Care | Attending: Ophthalmology

## 2020-03-19 ENCOUNTER — Ambulatory Visit (HOSPITAL_COMMUNITY): Payer: Medicare PPO

## 2020-03-19 DIAGNOSIS — E1136 Type 2 diabetes mellitus with diabetic cataract: Secondary | ICD-10-CM | POA: Insufficient documentation

## 2020-03-19 DIAGNOSIS — H02834 Dermatochalasis of left upper eyelid: Secondary | ICD-10-CM | POA: Diagnosis not present

## 2020-03-19 DIAGNOSIS — F17200 Nicotine dependence, unspecified, uncomplicated: Secondary | ICD-10-CM | POA: Insufficient documentation

## 2020-03-19 DIAGNOSIS — E119 Type 2 diabetes mellitus without complications: Secondary | ICD-10-CM | POA: Diagnosis not present

## 2020-03-19 DIAGNOSIS — H02831 Dermatochalasis of right upper eyelid: Secondary | ICD-10-CM | POA: Diagnosis not present

## 2020-03-19 DIAGNOSIS — H0100B Unspecified blepharitis left eye, upper and lower eyelids: Secondary | ICD-10-CM | POA: Diagnosis not present

## 2020-03-19 DIAGNOSIS — Z79899 Other long term (current) drug therapy: Secondary | ICD-10-CM | POA: Diagnosis not present

## 2020-03-19 DIAGNOSIS — Z885 Allergy status to narcotic agent status: Secondary | ICD-10-CM | POA: Insufficient documentation

## 2020-03-19 DIAGNOSIS — H0100A Unspecified blepharitis right eye, upper and lower eyelids: Secondary | ICD-10-CM | POA: Diagnosis not present

## 2020-03-19 DIAGNOSIS — H25811 Combined forms of age-related cataract, right eye: Secondary | ICD-10-CM | POA: Diagnosis not present

## 2020-03-19 DIAGNOSIS — I10 Essential (primary) hypertension: Secondary | ICD-10-CM | POA: Diagnosis not present

## 2020-03-19 HISTORY — PX: CATARACT EXTRACTION W/PHACO: SHX586

## 2020-03-19 LAB — GLUCOSE, CAPILLARY: Glucose-Capillary: 137 mg/dL — ABNORMAL HIGH (ref 70–99)

## 2020-03-19 SURGERY — PHACOEMULSIFICATION, CATARACT, WITH IOL INSERTION
Anesthesia: Monitor Anesthesia Care | Site: Eye | Laterality: Right

## 2020-03-19 MED ORDER — TETRACAINE HCL 0.5 % OP SOLN
1.0000 [drp] | OPHTHALMIC | Status: AC | PRN
  Administered 2020-03-19 (×3): 1 [drp] via OPHTHALMIC

## 2020-03-19 MED ORDER — CYCLOPENTOLATE-PHENYLEPHRINE 0.2-1 % OP SOLN
1.0000 [drp] | OPHTHALMIC | Status: AC | PRN
  Administered 2020-03-19 (×3): 1 [drp] via OPHTHALMIC

## 2020-03-19 MED ORDER — PROVISC 10 MG/ML IO SOLN
INTRAOCULAR | Status: DC | PRN
  Administered 2020-03-19: 0.85 mL via INTRAOCULAR

## 2020-03-19 MED ORDER — POVIDONE-IODINE 5 % OP SOLN
OPHTHALMIC | Status: DC | PRN
  Administered 2020-03-19: 1 via OPHTHALMIC

## 2020-03-19 MED ORDER — EPINEPHRINE PF 1 MG/ML IJ SOLN
INTRAMUSCULAR | Status: AC
  Filled 2020-03-19: qty 1

## 2020-03-19 MED ORDER — LIDOCAINE HCL 3.5 % OP GEL
1.0000 "application " | Freq: Once | OPHTHALMIC | Status: AC
  Administered 2020-03-19: 1 via OPHTHALMIC

## 2020-03-19 MED ORDER — STERILE WATER FOR IRRIGATION IR SOLN
Status: DC | PRN
  Administered 2020-03-19: 250 mL

## 2020-03-19 MED ORDER — LIDOCAINE HCL (PF) 1 % IJ SOLN
INTRAOCULAR | Status: DC | PRN
  Administered 2020-03-19: 08:00:00 1 mL via OPHTHALMIC

## 2020-03-19 MED ORDER — SODIUM HYALURONATE 23 MG/ML IO SOLN
INTRAOCULAR | Status: DC | PRN
  Administered 2020-03-19: 0.6 mL via INTRAOCULAR

## 2020-03-19 MED ORDER — BSS IO SOLN
INTRAOCULAR | Status: DC | PRN
  Administered 2020-03-19: 15 mL via INTRAOCULAR

## 2020-03-19 MED ORDER — PHENYLEPHRINE HCL 2.5 % OP SOLN
1.0000 [drp] | OPHTHALMIC | Status: AC | PRN
  Administered 2020-03-19 (×3): 1 [drp] via OPHTHALMIC

## 2020-03-19 MED ORDER — EPINEPHRINE PF 1 MG/ML IJ SOLN
INTRAOCULAR | Status: DC | PRN
  Administered 2020-03-19: 08:00:00 500 mL

## 2020-03-19 SURGICAL SUPPLY — 19 items
CLOTH BEACON ORANGE TIMEOUT ST (SAFETY) ×2 IMPLANT
DEVICE MILOOP (MISCELLANEOUS) IMPLANT
EYE SHIELD UNIVERSAL CLEAR (GAUZE/BANDAGES/DRESSINGS) ×2 IMPLANT
GLOVE BIOGEL PI IND STRL 6.5 (GLOVE) IMPLANT
GLOVE BIOGEL PI IND STRL 7.0 (GLOVE) IMPLANT
GLOVE BIOGEL PI INDICATOR 6.5 (GLOVE)
GLOVE BIOGEL PI INDICATOR 7.0 (GLOVE) ×2
MILOOP DEVICE (MISCELLANEOUS)
NDL HYPO 18GX1.5 BLUNT FILL (NEEDLE) IMPLANT
NEEDLE HYPO 18GX1.5 BLUNT FILL (NEEDLE) ×3 IMPLANT
PAD ARMBOARD 7.5X6 YLW CONV (MISCELLANEOUS) ×2 IMPLANT
RING MALYGIN (MISCELLANEOUS) IMPLANT
RING MALYGIN 7.0 (MISCELLANEOUS) IMPLANT
SYR TB 1ML LL NO SAFETY (SYRINGE) ×2 IMPLANT
TAPE SURG TRANSPORE 1 IN (GAUZE/BANDAGES/DRESSINGS) IMPLANT
TAPE SURGICAL TRANSPORE 1 IN (GAUZE/BANDAGES/DRESSINGS) ×3
Tecnis 1 piece Intraocular lens (Intraocular Lens) ×2 IMPLANT
VISCOELASTIC ADDITIONAL (OPHTHALMIC RELATED) IMPLANT
WATER STERILE IRR 250ML POUR (IV SOLUTION) ×2 IMPLANT

## 2020-03-19 NOTE — Anesthesia Preprocedure Evaluation (Signed)
Anesthesia Evaluation  Patient identified by MRN, date of birth, ID band Patient awake    Reviewed: Allergy & Precautions, H&P , NPO status , Patient's Chart, lab work & pertinent test results, reviewed documented beta blocker date and time   Airway Mallampati: II  TM Distance: >3 FB Neck ROM: full    Dental no notable dental hx.    Pulmonary neg pulmonary ROS, Current Smoker,    Pulmonary exam normal breath sounds clear to auscultation       Cardiovascular Exercise Tolerance: Good hypertension, negative cardio ROS   Rhythm:regular Rate:Normal     Neuro/Psych negative neurological ROS  negative psych ROS   GI/Hepatic Neg liver ROS, GERD  Medicated,  Endo/Other  negative endocrine ROSdiabetes  Renal/GU negative Renal ROS  negative genitourinary   Musculoskeletal   Abdominal   Peds  Hematology negative hematology ROS (+)   Anesthesia Other Findings   Reproductive/Obstetrics negative OB ROS                             Anesthesia Physical Anesthesia Plan  ASA: III  Anesthesia Plan: MAC   Post-op Pain Management:    Induction:   PONV Risk Score and Plan:   Airway Management Planned:   Additional Equipment:   Intra-op Plan:   Post-operative Plan:   Informed Consent: I have reviewed the patients History and Physical, chart, labs and discussed the procedure including the risks, benefits and alternatives for the proposed anesthesia with the patient or authorized representative who has indicated his/her understanding and acceptance.     Dental Advisory Given  Plan Discussed with: CRNA  Anesthesia Plan Comments:         Anesthesia Quick Evaluation

## 2020-03-19 NOTE — Interval H&P Note (Signed)
History and Physical Interval Note:  03/19/2020 8:01 AM  Michele Pace  has presented today for surgery, with the diagnosis of Nuclear sclerotic cataract - Right eye.  The various methods of treatment have been discussed with the patient and family. After consideration of risks, benefits and other options for treatment, the patient has consented to  Procedure(s) with comments: CATARACT EXTRACTION PHACO AND INTRAOCULAR LENS PLACEMENT (IOC) (Right) - right as a surgical intervention.  The patient's history has been reviewed, patient examined, no change in status, stable for surgery.  I have reviewed the patient's chart and labs.  Questions were answered to the patient's satisfaction.     Baruch Goldmann

## 2020-03-19 NOTE — Anesthesia Postprocedure Evaluation (Signed)
Anesthesia Post Note  Patient: Michele Pace  Procedure(s) Performed: CATARACT EXTRACTION PHACO AND INTRAOCULAR LENS PLACEMENT (IOC) (Right Eye)  Patient location during evaluation: Phase II Anesthesia Type: MAC Level of consciousness: awake and oriented Pain management: pain level controlled Vital Signs Assessment: post-procedure vital signs reviewed and stable Respiratory status: respiratory function stable and spontaneous breathing Cardiovascular status: blood pressure returned to baseline and stable Postop Assessment: no apparent nausea or vomiting and adequate PO intake Anesthetic complications: no   No complications documented.   Last Vitals:  Vitals:   03/19/20 0715  BP: 124/71  Pulse: 81  Resp: (!) 30  Temp: (!) 36.3 C  SpO2: 95%    Last Pain:  Vitals:   03/19/20 0715  PainSc: 0-No pain                 Karna Dupes

## 2020-03-19 NOTE — Transfer of Care (Signed)
Immediate Anesthesia Transfer of Care Note  Patient: Michele Pace  Procedure(s) Performed: CATARACT EXTRACTION PHACO AND INTRAOCULAR LENS PLACEMENT (IOC) (Right Eye)  Patient Location: PACU  Anesthesia Type:MAC  Level of Consciousness: awake, alert  and oriented  Airway & Oxygen Therapy: Patient Spontanous Breathing  Post-op Assessment: Report given to RN and Post -op Vital signs reviewed and stable  Post vital signs: Reviewed and stable  Last Vitals:  Vitals Value Taken Time  BP    Temp    Pulse    Resp    SpO2      Last Pain:  Vitals:   03/19/20 0715  PainSc: 0-No pain         Complications: No complications documented.

## 2020-03-19 NOTE — Discharge Instructions (Signed)
Please discharge patient when stable, will follow up today with Dr. Jame Seelig at the [facility] office immediately following discharge.  Leave shield in place until visit.  All paperwork with discharge instructions will be given at the office.  [facility] Address:  [address]  [place], [place] [address]  

## 2020-03-19 NOTE — Op Note (Signed)
Date of procedure: 03/19/20  Pre-operative diagnosis:  Visually significant combined form age-related cataract, Right Eye (H25.811)  Post-operative diagnosis:  Visually significant combined form age-related cataract, Right Eye (H25.811)  Procedure: Removal of cataract via phacoemulsification and insertion of intra-ocular lens Wynetta Emery and [employer] [id number]  +13.0D into the capsular bag of the Right Eye  Attending surgeon: Gerda Diss. Sandria Mcenroe, MD, MA  Anesthesia: MAC, Topical Akten  Complications: None  Estimated Blood Loss: <49m (minimal)  Specimens: None  Implants: As above  Indications:  Visually significant age-related cataract, Right Eye  Procedure:  The patient was seen and identified in the pre-operative area. The operative eye was identified and dilated.  The operative eye was marked.  Topical anesthesia was administered to the operative eye.     The patient was then to the operative suite and placed in the supine position.  A timeout was performed confirming the patient, procedure to be performed, and all other relevant information.   The patient's face was prepped and draped in the usual fashion for intra-ocular surgery.  A lid speculum was placed into the operative eye and the surgical microscope moved into place and focused.  A superotemporal paracentesis was created using a 20 gauge paracentesis blade.  Shugarcaine was injected into the anterior chamber.  Viscoelastic was injected into the anterior chamber.  A temporal clear-corneal main wound incision was created using a 2.423mmicrokeratome.  A continuous curvilinear capsulorrhexis was initiated using an irrigating cystitome and completed using capsulorrhexis forceps.  Hydrodissection and hydrodeliniation were performed.  Viscoelastic was injected into the anterior chamber.  A phacoemulsification handpiece and a chopper as a second instrument were used to remove the nucleus and epinucleus. The irrigation/aspiration handpiece was  used to remove any remaining cortical material.   The capsular bag was reinflated with viscoelastic, checked, and found to be intact.  The intraocular lens was inserted into the capsular bag.  The irrigation/aspiration handpiece was used to remove any remaining viscoelastic.  The clear corneal wound and paracentesis wounds were then hydrated and checked with Weck-Cels to be watertight.  The lid-speculum was removed.  The drape was removed.  The patient's face was cleaned with a wet and dry 4x4. A clear shield was taped over the eye. The patient was taken to the post-operative care unit in good condition, having tolerated the procedure well.  Post-Op Instructions: [facility] and will receive all other orders and instructions.

## 2020-03-20 ENCOUNTER — Encounter: Payer: Self-pay | Admitting: Gastroenterology

## 2020-03-20 ENCOUNTER — Ambulatory Visit: Payer: Medicare PPO | Admitting: Gastroenterology

## 2020-03-20 VITALS — BP 130/78 | HR 76 | Ht 63.0 in | Wt 149.0 lb

## 2020-03-20 DIAGNOSIS — R935 Abnormal findings on diagnostic imaging of other abdominal regions, including retroperitoneum: Secondary | ICD-10-CM | POA: Diagnosis not present

## 2020-03-20 DIAGNOSIS — K8681 Exocrine pancreatic insufficiency: Secondary | ICD-10-CM | POA: Diagnosis not present

## 2020-03-20 DIAGNOSIS — K861 Other chronic pancreatitis: Secondary | ICD-10-CM

## 2020-03-20 DIAGNOSIS — K8689 Other specified diseases of pancreas: Secondary | ICD-10-CM

## 2020-03-20 NOTE — Patient Instructions (Addendum)
It has been recommended to you by your physician that you have a(n) EUS completed. Per your request, we did not schedule the procedure(s) today. You would like to wait until Feb 2022 due to insurance changes. Please contact our office at [phone] to schedule this.   You will also need labs in Jan 2022 prior to having your EUS done.   Consider Trial of Fiber Con once daily or Metamucil.    If you are age 77 or older, your body mass index should be between 23-30. Your Body mass index is 26.39 kg/m. If this is out of the aforementioned range listed, please consider follow up with your Primary Care Provider.  Thank you for choosing me and [facility].  Dr. Rush Landmark

## 2020-03-20 NOTE — Progress Notes (Signed)
[place] VISIT   Primary Care Provider Monico Blitz, [place] [address] [phone]  Referring Provider Dr. Gala Romney and PA Bobby Rumpf  Patient Profile: Michele Pace is a 79 y.o. female with a pmh significant for hypertension, hyperlipidemia, gout, diabetes, colon polyps, diverticulosis, GERD, hiatal hernia, idiopathic chronic pancreatitis, EPI (presumptive).  The patient presents to the [facility] for an evaluation and management of problem(s) noted below:  Problem List 1. Idiopathic chronic pancreatitis ([place])   2. Dilated pancreatic duct   3. Exocrine pancreatic insufficiency   4. Abnormal MRI of pancreas   5. Abnormal CT of the pancreas     History of Present Illness This is the patient's first visit to the outpatient [facility] clinic.  She has been followed by Dr. Gala Romney and PA Bobby Rumpf of [facility].  The patient is referred by these providers for further evaluation in the setting of abnormal CT and MRI of the pancreas showing significant pancreatic duct dilation as well as concerning findings for potential chronic pancreatitis.  The patient has been dealing with issues of abdominal discomfort, abdominal pain, bloating, alternating bowel habits for years.  As things began to progress she followed up with GI for further evaluation.  After discussion with the [facility] GI group, I recommended considering starting the patient on Creon.  Patient is seen today and states that since the initiation of Creon she has been doing exceedingly well.  The symptoms of bloating and distention and discomfort have nearly completely abated.  Her previous issues of alternating constipation/diarrhea have also improved somewhat she is having at least 1 bowel movement per day but is having to start using oral fiber and laxatives and prune juice to help make sure that she does not feel like she is getting to constipated.  She denies any hematochezia. The  patient has had some weight loss but she states that it has been intentional in the setting of trying to maintain good diabetic control.  She is not having progressive issues with worsening diabetes.  She denies any significant history of prior pancreatitis.  She denies any significant alcohol consumption on a long-term basis.  She did deal with her husband who had alcohol issues years prior but was never a heavy alcohol consumer.  No family history of pancreatic disorders either.  GI Review of Systems Positive as above Negative for dysphagia, odynophagia, nausea, vomiting, pain, melena, hematochezia  Review of Systems General: Denies fevers/chills HEENT: Denies oral lesions Cardiovascular: Denies chest pain Pulmonary: Denies shortness of breath Gastroenterological: See HPI Genitourinary: Denies darkened urine or hematuria Hematological: Denies easy bruising/bleeding Endocrine: Denies temperature intolerance Dermatological: Denies jaundice Psychological: Mood is stable   Medications Current Outpatient Medications  Medication Sig Dispense Refill  . allopurinol (ZYLOPRIM) 300 MG tablet Take 300 mg by mouth daily.     Marland Kitchen amLODipine-benazepril (LOTREL) 5-20 MG per capsule Take 1 capsule by mouth daily.     Marland Kitchen Apoaequorin (PREVAGEN PO) Take 1 tablet by mouth daily.     Marland Kitchen aspirin 81 MG tablet Take 81 mg by mouth daily.    Marland Kitchen b complex vitamins tablet Take 1 tablet by mouth daily.    . calcium citrate (CALCITRATE - DOSED IN MG ELEMENTAL CALCIUM) 950 MG tablet Take 600 mg by mouth daily.    . Carboxymethylcellulose Sodium (THERATEARS) 0.25 % SOLN Place 1 drop into both eyes daily as needed (Dry eyes).    . cetirizine (ZYRTEC) 10 MG tablet Take 10  mg by mouth at bedtime.    . cholecalciferol (VITAMIN D) 25 MCG (1000 UNIT) tablet Take 1,000 Units by mouth daily.     . Coenzyme Q10 (CO Q 10) 100 MG CAPS Take 200 mg by mouth daily.     . Cranberry 500 MG TABS Take 500 mg by mouth daily.     .  Cyanocobalamin 2500 MCG TABS Take 2,500 mcg by mouth daily.     Marland Kitchen docusate sodium (COLACE) 100 MG capsule Take 300 mg by mouth daily.     . fluticasone (FLONASE) 50 MCG/ACT nasal spray Place 2 sprays into both nostrils at bedtime.     . lipase/protease/amylase (CREON) 36000 UNITS CPEP capsule Take two capsules(36,000) with meals and with a snack daily (8 capsules daily) (Patient taking differently: Take 72,000 Units by mouth 3 (three) times daily before meals. Take two capsules(36,000) with meals and with a snack daily (8 capsules daily)) 240 capsule 2  . multivitamin-iron-minerals-folic acid (CENTRUM) chewable tablet Chew 1 tablet by mouth daily.    Marland Kitchen omega-3 acid ethyl esters (LOVAZA) 1 G capsule Take 1 g by mouth daily.    Marland Kitchen omeprazole (PRILOSEC) 40 MG capsule Take 40 mg by mouth daily.     . pravastatin (PRAVACHOL) 20 MG tablet Take 20 mg by mouth at bedtime.     . Probiotic Product (PROBIOTIC PO) Take 1 tablet by mouth daily. [place]    . psyllium (METAMUCIL) 58.6 % powder Take 1 packet by mouth daily.    Marland Kitchen pyridOXINE (VITAMIN B-6) 100 MG tablet Take 100 mg by mouth daily.    . Tart Cherry 1200 MG CAPS Take 3,600 mg by mouth daily.    . vitamin C (ASCORBIC ACID) 500 MG tablet Take 1,000 mg by mouth daily.     . Vitamin E 180 MG (400 UNIT) CAPS Take 150 Units by mouth daily.     Marland Kitchen zinc gluconate 50 MG tablet Take 50 mg by mouth daily.     No current facility-administered medications for this visit.    Allergies Allergies  Allergen Reactions  . Codeine Nausea And Vomiting    Headache/ NV  . Sulfa Antibiotics Nausea And Vomiting    Mother was allergic    Histories Past Medical History:  Diagnosis Date  . Diabetes ([place])    diet controlled  . GERD (gastroesophageal reflux disease)   . Gout   . HTN (hypertension)   . Hx of adenomatous colonic polyps   . Hypercholesteremia    Past Surgical History:  Procedure Laterality Date  . ABDOMINAL HYSTERECTOMY    . CATARACT  EXTRACTION W/PHACO Left 06/14/2018   Procedure: CATARACT EXTRACTION PHACO AND INTRAOCULAR LENS PLACEMENT (IOC);  Surgeon: Baruch Goldmann, MD;  Location: [facility];  Service: Ophthalmology;  Laterality: Left;  CDE: 4.13  . CATARACT EXTRACTION W/PHACO Right 03/19/2020   Procedure: CATARACT EXTRACTION PHACO AND INTRAOCULAR LENS PLACEMENT (IOC);  Surgeon: Baruch Goldmann, MD;  Location: [facility];  Service: Ophthalmology;  Laterality: Right;  CDE 10.01  . COLONOSCOPY  10/25/07   RMR: normal rectum left sided diverticula  . COLONOSCOPY N/A 04/26/2014   Dr. Gala Romney: There are sigmoid diverticula, 3 mm polyp removed from the descending segment, tubular adenoma.  Next colonoscopy in 7 years  . ESOPHAGOGASTRODUODENOSCOPY  10/25/07   RXV:[id number] esophagogasteic junction focal antral erosions and bular erosions, otherwise normal. mild chronic gastritis.   Marland Kitchen PARATHYROIDECTOMY  1999/2000   removed two   Social History   Socioeconomic History  .  Marital status: Widowed    Spouse name: Not on file  . Number of children: Not on file  . Years of education: Not on file  . Highest education level: Not on file  Occupational History  . Occupation: retired Pharmacist, hospital  Tobacco Use  . Smoking status: Current Every Day Smoker    Packs/day: 0.25    Years: 25.00    Pack years: 6.25    Types: Cigarettes  . Smokeless tobacco: Never Used  Vaping Use  . Vaping Use: Never used  Substance and Sexual Activity  . Alcohol use: No    Alcohol/week: 0.0 standard drinks  . Drug use: No  . Sexual activity: Yes    Birth control/protection: Surgical  Other Topics Concern  . Not on file  Social History Narrative   Adopted daughter   Social Determinants of Health   Financial Resource Strain: Not on file  Food Insecurity: Not on file  Transportation Needs: Not on file  Physical Activity: Not on file  Stress: Not on file  Social Connections: Not on file  Intimate Partner Violence: Not on file   Family History  Problem  Relation Age of Onset  . Colon cancer Paternal Aunt   . Heart disease Paternal Aunt   . Heart disease Other   . Diabetes Mother   . Heart disease Sister   . Skin cancer Brother   . Heart disease Brother   . Heart disease Paternal Uncle   . Esophageal cancer Neg Hx   . Pancreatic cancer Neg Hx   . Stomach cancer Neg Hx    I have reviewed her medical, social, and family history in detail and updated the electronic medical record as necessary.    PHYSICAL EXAMINATION  BP 130/78 (BP Location: Left Arm, Patient Position: Sitting, Cuff Size: Normal)   Pulse 76   Ht 5\' 3"  (1.6 m)   Wt 149 lb (67.6 kg)   SpO2 97%   BMI 26.39 kg/m  Wt Readings from Last 3 Encounters:  03/20/20 149 lb (67.6 kg)  03/15/20 155 lb (70.3 kg)  11/09/19 155 lb 9.6 oz (70.6 kg)   GEN: NAD, appears stated age, doesn't appear chronically ill PSYCH: Cooperative, without pressured speech EYE: Conjunctivae pink, sclerae anicteric ENT: MMM, without oral ulcers, no erythema or exudates noted NECK: Supple CV: RR without R/Gs  RESP: CTAB posteriorly, without wheezing GI: NABS, soft, NT/ND, without rebound or guarding, no HSM appreciated GU: DRE shows MSK/EXT: _ edema, no palmar erythema SKIN: No jaundice, no spider angiomata, no concerning rashes NEURO:  Alert & Oriented x 3, no focal deficits, no evidence of asterixis   REVIEW OF DATA  I reviewed the following data at the time of this encounter:  GI Procedures and Studies  2016 colonoscopy COLON FINDINGS: Normal rectum. Scattered sigmoid diverticula; (1) 3 mm diminutive polyp in the mid descending segment; otherwise, the remainder of the colonic mucosa. Normal. The above-mentioned polyp was removed completely with 2 bites with a jumbo biopsy forceps. Retroflexed views revealed no abnormalities.  Laboratory Studies  Reviewed those in epic  Imaging Studies  October 2021 MRI abdomen with and without contrast IMPRESSION: 1. No definite abnormal  parenchymal hypoenhancement to suggest adenocarcinoma. 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 Consider  surveillance pancreatic imaging by CT or MRI in 6-12 months time. 2. Widespread early arterial phase heterogeneity in the liver is noted, but this resolves completely by the late arterial phase and is not visible on any other sequences. No mass effect. I favor incidental perfusion related heterogeneity over entities such as multiple focal nodular hyperplasia. 3. Bosniak category 2 cysts of the left kidney (complex but believed to be benign). 4. Lumbar spondylosis and degenerative disc disease. 5.  Aortic Atherosclerosis (ICD10-I70.0).  September 2021 CT chest abdomen pelvis with contrast IMPRESSION: 1. Stable RIGHT lower lobe pulmonary nodule over 2 year interval consistent benign pulmonary nodule. Additional nodule in the RIGHT lower lobe was not imaged on comparison CT abdomen. No follow-up needed if patient is low-risk. Non-contrast chest CT can be considered in 12 months if patient is high-risk. This recommendation follows the consensus statement: Guidelines for Management of Incidental Pulmonary Nodules Detected on CT Images: From the Fleischner Society 2017; Radiology 2017; 284:228-243. 2. New pancreatic duct dilatation is favored to represent present benign dilatation related to enlarging pancreatic head calcification. However, RECOMMEND MRI pancreatic protocol with contrast to evaluate for occult obstructing pancreatic neoplasm. 3. Several small early enhancing lesions in the RIGHT hepatic lobe are favored benign vascular phenomena. Recommend contrast MRI of the liver to coincide with MRI of the  pancreas recommended above. 4. Moderate volume stool in the rectosigmoid colon could indicate mild constipation. 5. Sigmoid diverticulosis without evidence of acute diverticulitis. 6. Aortic Atherosclerosis (ICD10-I70.0).  May 2019 CT abdomen pelvis with contrast IMPRESSION: No acute intra-abdominal or intrapelvic abnormalities. Few parenchymal calcifications at head/uncinate process of pancreas, could represent chronic calcific pancreatitis. Scattered atherosclerotic disease with upper normal caliber abdominal aorta 2.9 cm diameter. Nonspecific 6 mm splenic low-attenuation lesion. 6 mm diameter RIGHT lower lobe nodule image 6; non-contrast chest CT at 6-12 months is recommended. If the nodule is stable at time of repeat CT, then future CT at 18-24 months (from today's scan) is considered optional for low-risk patients, but is recommended for high-risk patients. This recommendation follows the consensus statement: Guidelines for Management of Incidental Pulmonary Nodules Detected on CT Images: From the Fleischner Society 2017; Radiology 2017; 284:228-243.   ASSESSMENT  Ms. Recktenwald is a 79 y.o. female with a pmh significant for hypertension, hyperlipidemia, gout, diabetes, colon polyps, diverticulosis, GERD, hiatal hernia, idiopathic chronic pancreatitis, EPI (presumptive).  The patient is seen today for evaluation and management of:  1. Idiopathic chronic pancreatitis ([place])   2. Dilated pancreatic duct   3. Exocrine pancreatic insufficiency   4. Abnormal MRI of pancreas   5. Abnormal CT of the pancreas    The patient is hemodynamically and clinically stable.  The patient's presentation is such of a person having idiopathic chronic pancreatitis.  The patient seems to be doing very well on the initiation of low-dose [place] enzyme replacement therapy.  Many of her symptoms have abated.  Why she has chronic pancreatitis is not clearly defined.  With that being said, the pancreatic duct  dilation does require additional work-up even though no evidence of a mass or lesion has been noted on cross-sectional imaging at least an endoscopic ultrasound will help guide to ensure we see nothing else in that region.  If this is a result of pancreatic duct stone disease within the pancreatic duct then consideration in the future of an ERCP for attempted extraction or removal of the stones could be of some benefit if she was having recurrent bouts of pancreatitis, but she is  not.  Certainly the potential of having a pancreatic duct stricture carries a chance of having underlying malignancy.  Thus again, endoscopic ultrasound will help guide and ensure were not missing something from that perspective.  If the patient continues to do well and an endoscopic ultrasound is unremarkable then I would just monitor this patient periodically with cross-sectional imaging or endoscopic ultrasound as we know that 5% of individuals who have chronic pancreatitis will develop in their lifetime pancreatic cancer.  The risks of an EUS including intestinal perforation, bleeding, infection, aspiration, and medication effects were discussed as was the possibility it may not give a definitive diagnosis if a biopsy is performed.  When a biopsy of the pancreas is done as part of the EUS, there is an additional risk of pancreatitis at the rate of about 1-2%.  It was explained that procedure related pancreatitis is typically mild, although it can be severe and even life threatening, which is why we do not perform random pancreatic biopsies and only biopsy a lesion/area we feel is concerning enough to warrant the risk.  The risks of an ERCP were discussed at length, including but not limited to the risk of perforation, bleeding, abdominal pain, post-ERCP pancreatitis (while usually mild can be severe and even life threatening).  The risks and benefits of endoscopic evaluation were discussed with the patient; these include but are not  limited to the risk of perforation, infection, bleeding, missed lesions, lack of diagnosis, severe illness requiring hospitalization, as well as anesthesia and sedation related illnesses.  The patient is agreeable to proceed with an endoscopic ultrasound which we will move forward with scheduling in the coming month based on the patient doing well overall.  We will obtain preprocedure labs as outlined below.  She will continue on her current replacement therapy.  All patient questions were answered to the best of my ability, and the patient agrees to the aforementioned plan of action with follow-up as indicated.   PLAN  Preprocedure labs as outlined below Proceed with scheduling endoscopic ultrasound to further delineate the pancreatic duct dilation and ensure that there is not a mass or lesion Continue pancreatic enzyme replacement therapy -72,000 units with each meal -36,000 to 72,000 units with each snack -There is only slight room for increasing her dose based on 500 units of lipase so for now we will maintain her current dosing   Orders Placed This Encounter  Procedures  . CBC  . Basic Metabolic Panel (BMET)  . INR/PT    New Prescriptions   No medications on file   Modified Medications   No medications on file    Planned Follow Up No follow-ups on file.   Total Time in Face-to-Face and in Coordination of Care for patient including independent/personal interpretation/review of prior testing, medical history, examination, medication adjustment, communicating results with the patient directly, and documentation with the EHR is 50 minutes.   Justice Britain, MD [facility] Office # [phone]

## 2020-04-02 DIAGNOSIS — F419 Anxiety disorder, unspecified: Secondary | ICD-10-CM | POA: Diagnosis not present

## 2020-04-02 DIAGNOSIS — K209 Esophagitis, unspecified without bleeding: Secondary | ICD-10-CM | POA: Diagnosis not present

## 2020-04-02 DIAGNOSIS — E1151 Type 2 diabetes mellitus with diabetic peripheral angiopathy without gangrene: Secondary | ICD-10-CM | POA: Diagnosis not present

## 2020-04-02 DIAGNOSIS — Z299 Encounter for prophylactic measures, unspecified: Secondary | ICD-10-CM | POA: Diagnosis not present

## 2020-04-02 DIAGNOSIS — I10 Essential (primary) hypertension: Secondary | ICD-10-CM | POA: Diagnosis not present

## 2020-04-02 DIAGNOSIS — E1165 Type 2 diabetes mellitus with hyperglycemia: Secondary | ICD-10-CM | POA: Diagnosis not present

## 2020-04-05 DIAGNOSIS — I10 Essential (primary) hypertension: Secondary | ICD-10-CM | POA: Diagnosis not present

## 2020-04-05 DIAGNOSIS — E119 Type 2 diabetes mellitus without complications: Secondary | ICD-10-CM | POA: Diagnosis not present

## 2020-05-04 ENCOUNTER — Other Ambulatory Visit: Payer: Self-pay | Admitting: Gastroenterology

## 2020-06-15 ENCOUNTER — Telehealth: Payer: Self-pay | Admitting: Gastroenterology

## 2020-06-15 DIAGNOSIS — K861 Other chronic pancreatitis: Secondary | ICD-10-CM

## 2020-06-15 DIAGNOSIS — K8689 Other specified diseases of pancreas: Secondary | ICD-10-CM

## 2020-06-15 DIAGNOSIS — K8681 Exocrine pancreatic insufficiency: Secondary | ICD-10-CM

## 2020-06-15 DIAGNOSIS — R935 Abnormal findings on diagnostic imaging of other abdominal regions, including retroperitoneum: Secondary | ICD-10-CM

## 2020-06-18 ENCOUNTER — Other Ambulatory Visit: Payer: Self-pay

## 2020-06-18 DIAGNOSIS — K8681 Exocrine pancreatic insufficiency: Secondary | ICD-10-CM

## 2020-06-18 DIAGNOSIS — R935 Abnormal findings on diagnostic imaging of other abdominal regions, including retroperitoneum: Secondary | ICD-10-CM

## 2020-06-18 DIAGNOSIS — K861 Other chronic pancreatitis: Secondary | ICD-10-CM

## 2020-06-18 NOTE — Telephone Encounter (Signed)
Left message on machine to call back  

## 2020-06-18 NOTE — Telephone Encounter (Signed)
EUS scheduled, pt instructed and medications reviewed.  Patient instructions mailed to home.  Patient to call with any questions or concerns.  COVID test scheduled -- the pt will come in for labs this week. She will call with any questions or concerns.

## 2020-08-06 ENCOUNTER — Other Ambulatory Visit (INDEPENDENT_AMBULATORY_CARE_PROVIDER_SITE_OTHER): Payer: Medicare Other

## 2020-08-06 ENCOUNTER — Other Ambulatory Visit (HOSPITAL_COMMUNITY)
Admission: RE | Admit: 2020-08-06 | Discharge: 2020-08-06 | Disposition: A | Discharge status: Home / Self Care | Payer: Medicare Other | Source: Ambulatory Visit | Attending: Gastroenterology | Admitting: Gastroenterology

## 2020-08-06 DIAGNOSIS — R935 Abnormal findings on diagnostic imaging of other abdominal regions, including retroperitoneum: Secondary | ICD-10-CM | POA: Diagnosis not present

## 2020-08-06 DIAGNOSIS — K8689 Other specified diseases of pancreas: Secondary | ICD-10-CM

## 2020-08-06 DIAGNOSIS — Z01812 Encounter for preprocedural laboratory examination: Secondary | ICD-10-CM | POA: Diagnosis present

## 2020-08-06 DIAGNOSIS — Z8719 Personal history of other diseases of the digestive system: Secondary | ICD-10-CM | POA: Diagnosis not present

## 2020-08-06 DIAGNOSIS — Z882 Allergy status to sulfonamides status: Secondary | ICD-10-CM | POA: Diagnosis not present

## 2020-08-06 DIAGNOSIS — K869 Disease of pancreas, unspecified: Secondary | ICD-10-CM | POA: Diagnosis present

## 2020-08-06 DIAGNOSIS — F17210 Nicotine dependence, cigarettes, uncomplicated: Secondary | ICD-10-CM | POA: Diagnosis not present

## 2020-08-06 DIAGNOSIS — K861 Other chronic pancreatitis: Secondary | ICD-10-CM

## 2020-08-06 DIAGNOSIS — Z20822 Contact with and (suspected) exposure to covid-19: Secondary | ICD-10-CM | POA: Insufficient documentation

## 2020-08-06 DIAGNOSIS — K449 Diaphragmatic hernia without obstruction or gangrene: Secondary | ICD-10-CM | POA: Diagnosis not present

## 2020-08-06 DIAGNOSIS — Z885 Allergy status to narcotic agent status: Secondary | ICD-10-CM | POA: Diagnosis not present

## 2020-08-06 DIAGNOSIS — R140 Abdominal distension (gaseous): Secondary | ICD-10-CM | POA: Diagnosis present

## 2020-08-06 DIAGNOSIS — Z86010 Personal history of colonic polyps: Secondary | ICD-10-CM | POA: Diagnosis not present

## 2020-08-06 DIAGNOSIS — K3189 Other diseases of stomach and duodenum: Secondary | ICD-10-CM | POA: Diagnosis not present

## 2020-08-06 LAB — BASIC METABOLIC PANEL
BUN: 13 mg/dL (ref 6–23)
CO2: 26 mEq/L (ref 19–32)
Calcium: 10.8 mg/dL — ABNORMAL HIGH (ref 8.4–10.5)
Chloride: 103 mEq/L (ref 96–112)
Creatinine, Ser: 0.73 mg/dL (ref 0.40–1.20)
GFR: 79.26 mL/min (ref >60.00)
Glucose, Bld: 140 mg/dL — ABNORMAL HIGH (ref 70–99)
Potassium: 4.2 mEq/L (ref 3.5–5.1)
Sodium: 139 mEq/L (ref 135–145)

## 2020-08-06 LAB — CBC
HCT: 44.5 % (ref 36.0–46.0)
Hemoglobin: 15.3 g/dL — ABNORMAL HIGH (ref 12.0–15.0)
MCHC: 34.4 g/dL (ref 30.0–36.0)
MCV: 95.9 fl (ref 78.0–100.0)
Platelets: 218 10*3/uL (ref 150.0–400.0)
RBC: 4.64 Mil/uL (ref 3.87–5.11)
RDW: 13.8 % (ref 11.5–15.5)
WBC: 9.2 10*3/uL (ref 4.0–10.5)

## 2020-08-06 LAB — PROTIME-INR
INR: 1 ratio (ref 0.8–1.0)
Prothrombin Time: 11.7 s (ref 9.6–13.1)

## 2020-08-07 LAB — SARS CORONAVIRUS 2 (TAT 6-24 HRS): SARS Coronavirus 2: NEGATIVE

## 2020-08-08 ENCOUNTER — Encounter (HOSPITAL_COMMUNITY): Payer: Self-pay | Admitting: Gastroenterology

## 2020-08-08 ENCOUNTER — Other Ambulatory Visit: Payer: Self-pay

## 2020-08-08 NOTE — Progress Notes (Signed)
Patient denies shortness of breath, fever, cough or chest pain.  PCP - Dr Monico Blitz Cardiologist - n/a  CT Chest - 01/02/20  EKG - 03/15/20 Stress Test - n/a ECHO - n/a Cardiac Cath - n/a  Fasting Blood Sugar - 110-120s Checks Blood Sugar 2 weekly  . Do not take oral diabetes medicines (pills) the morning of surgery.  . If your blood sugar is less than 70 mg/dL, you will need to treat for low blood sugar: o Treat a low blood sugar (less than 70 mg/dL) with  cup of clear juice (cranberry or apple), 4 glucose tablets, OR glucose gel. o Recheck blood sugar in 15 minutes after treatment (to make sure it is greater than 70 mg/dL). If your blood sugar is not greater than 70 mg/dL on recheck, call [phone] for further instructions.  Aspirin Instructions: Follow your surgeon's instructions on when to stop aspirin prior to surgery,  If no instructions were given by your surgeon then you will need to call the office for those instructions.  STOP now taking any Aspirin (unless otherwise instructed by your surgeon), Aleve, Naproxen, Ibuprofen, Motrin, Advil, Goody's, BC's, all herbal medications, fish oil, and all vitamins.   Coronavirus Screening Covid test on 08/06/20 was negative.  Patient verbalized understanding of instructions that were given via phone.

## 2020-08-09 ENCOUNTER — Ambulatory Visit (HOSPITAL_COMMUNITY): Payer: Medicare Other | Admitting: Certified Registered Nurse Anesthetist

## 2020-08-09 ENCOUNTER — Encounter (HOSPITAL_COMMUNITY)
Admission: RE | Disposition: A | Discharge status: Home / Self Care | Payer: Self-pay | Source: Home / Self Care | Attending: Gastroenterology

## 2020-08-09 ENCOUNTER — Encounter (HOSPITAL_COMMUNITY): Payer: Self-pay | Admitting: Gastroenterology

## 2020-08-09 ENCOUNTER — Other Ambulatory Visit: Payer: Self-pay

## 2020-08-09 ENCOUNTER — Ambulatory Visit (HOSPITAL_COMMUNITY)
Admission: RE | Admit: 2020-08-09 | Discharge: 2020-08-09 | Disposition: A | Discharge status: Home / Self Care | Payer: Medicare Other | Attending: Gastroenterology | Admitting: Gastroenterology

## 2020-08-09 DIAGNOSIS — K449 Diaphragmatic hernia without obstruction or gangrene: Secondary | ICD-10-CM | POA: Insufficient documentation

## 2020-08-09 DIAGNOSIS — Z882 Allergy status to sulfonamides status: Secondary | ICD-10-CM | POA: Diagnosis not present

## 2020-08-09 DIAGNOSIS — Z86010 Personal history of colonic polyps: Secondary | ICD-10-CM | POA: Diagnosis not present

## 2020-08-09 DIAGNOSIS — K869 Disease of pancreas, unspecified: Secondary | ICD-10-CM | POA: Diagnosis not present

## 2020-08-09 DIAGNOSIS — K3189 Other diseases of stomach and duodenum: Secondary | ICD-10-CM | POA: Insufficient documentation

## 2020-08-09 DIAGNOSIS — Z8719 Personal history of other diseases of the digestive system: Secondary | ICD-10-CM | POA: Diagnosis not present

## 2020-08-09 DIAGNOSIS — Z885 Allergy status to narcotic agent status: Secondary | ICD-10-CM | POA: Diagnosis not present

## 2020-08-09 DIAGNOSIS — F17210 Nicotine dependence, cigarettes, uncomplicated: Secondary | ICD-10-CM | POA: Diagnosis not present

## 2020-08-09 DIAGNOSIS — K8689 Other specified diseases of pancreas: Secondary | ICD-10-CM | POA: Diagnosis not present

## 2020-08-09 DIAGNOSIS — R140 Abdominal distension (gaseous): Secondary | ICD-10-CM | POA: Diagnosis present

## 2020-08-09 HISTORY — PX: ESOPHAGOGASTRODUODENOSCOPY (EGD) WITH PROPOFOL: SHX5813

## 2020-08-09 HISTORY — PX: BIOPSY: SHX5522

## 2020-08-09 HISTORY — PX: EUS: SHX5427

## 2020-08-09 SURGERY — UPPER ENDOSCOPIC ULTRASOUND (EUS) RADIAL
Anesthesia: Monitor Anesthesia Care

## 2020-08-09 MED ORDER — LIDOCAINE 2% (20 MG/ML) 5 ML SYRINGE
INTRAMUSCULAR | Status: DC | PRN
  Administered 2020-08-09: 40 mg via INTRAVENOUS

## 2020-08-09 MED ORDER — PROPOFOL 500 MG/50ML IV EMUL
INTRAVENOUS | Status: DC | PRN
  Administered 2020-08-09: 100 ug/kg/min via INTRAVENOUS

## 2020-08-09 MED ORDER — PROPOFOL 10 MG/ML IV BOLUS
INTRAVENOUS | Status: DC | PRN
  Administered 2020-08-09 (×4): 10 mg via INTRAVENOUS

## 2020-08-09 MED ORDER — HYDRALAZINE HCL 20 MG/ML IJ SOLN
INTRAMUSCULAR | Status: DC | PRN
  Administered 2020-08-09: 5 mg via INTRAVENOUS

## 2020-08-09 MED ORDER — LACTATED RINGERS IV SOLN: INTRAVENOUS | Status: DC

## 2020-08-09 NOTE — Transfer of Care (Signed)
Immediate Anesthesia Transfer of Care Note  Patient: Michele Pace  Procedure(s) Performed: UPPER ENDOSCOPIC ULTRASOUND (EUS) RADIAL (N/A ) BIOPSY  Patient Location: PACU  Anesthesia Type:MAC  Level of Consciousness: drowsy and patient cooperative  Airway & Oxygen Therapy: Patient Spontanous Breathing and Patient connected to nasal cannula oxygen  Post-op Assessment: Report given to RN and Post -op Vital signs reviewed and stable  Post vital signs: Reviewed  Last Vitals:  Vitals Value Taken Time  BP 129/67 08/09/20 0822  Temp    Pulse 78 08/09/20 0823  Resp 25 08/09/20 0823  SpO2 100 % 08/09/20 0823  Vitals shown include unvalidated device data.  Last Pain:  Vitals:   08/09/20 0709  TempSrc: Oral  PainSc: 0-No pain         Complications: No complications documented.

## 2020-08-09 NOTE — Op Note (Signed)
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                            Manuella Ghazi MD, MD Medicines:                Monitored Anesthesia Care Complications:            No immediate complications. Estimated Blood Loss:     Estimated blood loss was minimal. Procedure:                Pre-Anesthesia Assessment:                           - Prior to the procedure, a History and Physical                            was performed, and patient medications and                            allergies were reviewed. The patient's tolerance of                            previous anesthesia was also reviewed. The risks                            and benefits of the procedure and the sedation                            options and risks were discussed with the patient.                            All questions were answered, and informed consent  was obtained. Prior Anticoagulants: The patient has                            taken no previous anticoagulant or antiplatelet                            agents except for aspirin. ASA Grade Assessment:                            III - A patient with severe systemic disease. After                            reviewing the risks and benefits, the patient was                            deemed in  satisfactory condition to undergo the                            procedure.                           After obtaining informed consent, the endoscope was                            passed under direct vision. Throughout the                            procedure, the patient's blood pressure, pulse, and                            oxygen saturations were monitored continuously. The                            GIF-H190 ([id number]) Olympus gastroscope was                            introduced through the mouth, and advanced to the                            second part of duodenum. The TJF- Q180V ([id number])                            Olympus duodenoscope was introduced through the                            mouth, and advanced to the second part of duodenum.                            The GF-[id number] ([id number]) Olympus Linear EUS scope                            was introduced through the mouth, and advanced to  the duodenum for ultrasound examination from the                            stomach and duodenum. The upper EUS was                            accomplished without difficulty. The patient                            tolerated the procedure. Scope In: Scope Out: Findings:      ENDOSCOPIC FINDING: :      No gross lesions were noted in the entire esophagus.      The Z-line was regular and was found 37 cm from the incisors.      A 2 cm hiatal hernia was present.      Patchy mildly erythematous mucosa without bleeding was found in the       gastric body and in the gastric antrum.      No gross lesions were noted in the entire examined stomach. Biopsies       were taken with a cold forceps for histology and Helicobacter pylori       testing.      A moderate angulation deformity was found in the D1/D2 angle that       required significant torquing to allow passage of endoscope and       duodenoscope.      No other gross mucosal lesions were noted in the duodenal bulb, in  the       first portion of the duodenum and in the second portion of the duodenum.      The major papilla was normal and hidden partially under a hood.      ENDOSONOGRAPHIC FINDING: :      Pancreatic parenchymal abnormalities were noted in the entire pancreas.       These consisted of atrophy, hyperechoic foci with shadowing, lobularity       without honeycombing and hyperechoic strands.      The pancreatic duct had a prominently branched endosonographic       appearance in the body of the pancreas and tail of the pancreas.      The pancreatic duct had multiple intraductal stones in the pancreatic       head, genu of the pancreas and body of the pancreas.      The pancreatic duct had a tortuous/ectatic appearance and had       hyperechoic walls in the entire pancreas. The pancreatic duct in the       head measured 5.0 mm, the pancreatic duct in the neck measured 5.6 mm,       the pancreatic duct in the body measured 4.6 mm, the pancreatic duct in       the tail measured 3.8 mm.      Endosonographic imaging in the entire pancreas showed no mass-lesion.      There was no sign of significant endosonographic abnormality in the       common bile duct (6.0 mm), in the common hepatic duct and in the       gallbladder. No stones and ducts of normal caliber were identified.      Endosonographic imaging in the visualized portion of the liver showed no       mass-lesion.  No malignant-appearing lymph nodes were visualized in the celiac region       (level 20), peripancreatic region and porta hepatis region.      The celiac region was visualized. Impression:               EGD Impression:                           - No gross lesions in esophagus. Z-line regular, 37                            cm from the incisors.                           - 2 cm hiatal hernia.                           - Erythematous mucosa in the gastric body and                            antrum. No other gross lesions in the  stomach.                            Biopsied.                           - Duodenal angulation deformity at D1/D2 sweep.                           - No gross mucosal lesions in the duodenal bulb, in                            the first portion of the duodenum and in the second                            portion of the duodenum.                           - Normal major papilla.                           EUS Impression:                           - Pancreatic parenchymal abnormalities consisting                            of atrophy, hyperechoic foci, lobularity and                            hyperechoic strands were noted in the entire                            pancreas.                           - The pancreatic duct had a prominently  branched                            endosonographic appearance in the body of the                            pancreas and tail of the pancreas.                           - The pancreatic duct had multiple intraductal                            stones in the pancreatic head, genu of the pancreas                            and body of the pancreas.                           - The pancreatic duct had a tortuous/ectatic                            appearance and had hyperechoic walls in the entire                            pancreas. The pancreatic duct was dilated as above.                           - No pancreatic mass noted.                           - There was no sign of significant pathology in the                            common bile duct, in the common hepatic duct and in                            the gallbladder.                           - No malignant-appearing lymph nodes were                            visualized in the celiac region (level 20),                            peripancreatic region and porta hepatis region. Recommendation:           - The patient will be observed post-procedure,                            until all discharge criteria  are met.                           - Discharge patient to home.                           -  Observe patient's clinical course.                           - Await path results.                           - Try to get Creon Patient Assistance to help                            restart Creon (she had tremendous improvement in                            symptoms while on this but due to insurance change                            at beginning of year she has not had this                            medication - she did not communicate this to any of                            her GI providers however). I have reached out to my                            staff to see if they can begin the application                            process.                           - Pancreatic ERCP if patient has significant                            worsening of symptoms or develops recurrent                            pancreatitis could be considered, but with multiple                            intraductal stones all the way to the distal body                            of the tail, multiple procedures could be necessary.                           - She should undergo some sort of imaging every 2-3                            years (if stable) due to increased risk of                            Pancreatic Cancer in patients with Chronic  Pancreatitis.                           - The findings and recommendations were discussed                            with the patient.                           - The findings and recommendations were discussed                            with the designated responsible adult. Procedure Code(s):        --- Professional ---                           [phone], Esophagogastroduodenoscopy, flexible,                            transoral; with endoscopic ultrasound examination                            limited to the esophagus, stomach or duodenum, and                             adjacent structures                           43239, Esophagogastroduodenoscopy, flexible,                            transoral; with biopsy, single or multiple Diagnosis Code(s):        --- Professional ---                           K44.9, Diaphragmatic hernia without obstruction or                            gangrene                           K31.89, Other diseases of stomach and duodenum                           K86.9, Disease of pancreas, unspecified                           K86.89, Other specified diseases of pancreas                           R93.3, Abnormal findings on diagnostic imaging of                            other parts of digestive tract                           I89.9, Noninfective disorder of lymphatic vessels  and lymph nodes, unspecified                           R10.84, Generalized abdominal pain                           R14.0, Abdominal distension (gaseous)                           R93.2, Abnormal findings on diagnostic imaging of                            liver and biliary tract CPT copyright 2019 American Medical Association. All rights reserved. The codes documented in this report are preliminary and upon coder review may  be revised to meet current compliance requirements. Justice Britain, MD 08/09/2020 8:38:49 AM Number of Addenda: 0

## 2020-08-09 NOTE — Anesthesia Preprocedure Evaluation (Addendum)
Anesthesia Evaluation  Patient identified by MRN, date of birth, ID band Patient awake    Reviewed: Allergy & Precautions, NPO status , Patient's Chart, lab work & pertinent test results  Airway Mallampati: II  TM Distance: >3 FB     Dental   Pulmonary Current Smoker and Patient abstained from smoking.,    breath sounds clear to auscultation       Cardiovascular hypertension,  Rhythm:Regular Rate:Normal     Neuro/Psych  Neuromuscular disease    GI/Hepatic Neg liver ROS, GERD  ,  Endo/Other  diabetes  Renal/GU      Musculoskeletal   Abdominal   Peds  Hematology   Anesthesia Other Findings   Reproductive/Obstetrics                            Anesthesia Physical Anesthesia Plan  ASA: III  Anesthesia Plan: MAC   Post-op Pain Management:    Induction: Intravenous  PONV Risk Score and Plan: 1 and Propofol infusion  Airway Management Planned: Nasal Cannula and Simple Face Mask  Additional Equipment:   Intra-op Plan:   Post-operative Plan:   Informed Consent: I have reviewed the patients History and Physical, chart, labs and discussed the procedure including the risks, benefits and alternatives for the proposed anesthesia with the patient or authorized representative who has indicated his/her understanding and acceptance.     Dental advisory given  Plan Discussed with: CRNA and Anesthesiologist  Anesthesia Plan Comments:         Anesthesia Quick Evaluation

## 2020-08-09 NOTE — Anesthesia Postprocedure Evaluation (Signed)
Anesthesia Post Note  Patient: Michele Pace  Procedure(s) Performed: UPPER ENDOSCOPIC ULTRASOUND (EUS) RADIAL (N/A ) BIOPSY     Patient location during evaluation: Endoscopy Anesthesia Type: MAC Level of consciousness: awake Pain management: pain level controlled Vital Signs Assessment: post-procedure vital signs reviewed and stable Respiratory status: spontaneous breathing Cardiovascular status: stable Postop Assessment: no apparent nausea or vomiting Anesthetic complications: no   No complications documented.  Last Vitals:  Vitals:   08/09/20 0837 08/09/20 0852  BP: 135/84 (!) 146/85  Pulse: 74   Resp: 14   Temp:  (!) 36.2 C  SpO2: 100%     Last Pain:  Vitals:   08/09/20 0830  TempSrc:   PainSc: 0-No pain                 Winter Jocelyn

## 2020-08-09 NOTE — H&P (Signed)
GASTROENTEROLOGY PROCEDURE H&P NOTE   Primary Care Physician: Monico Blitz, MD  HPI: Michele Pace is a 80 y.o. 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  Past Medical History:  Diagnosis Date  . Diabetes ([place])    diet controlled - no meds  . GERD (gastroesophageal reflux disease)   . Gout   . HTN (hypertension)   . Hx of adenomatous colonic polyps   . Hypercholesteremia    Past Surgical History:  Procedure Laterality Date  . ABDOMINAL HYSTERECTOMY    . CATARACT EXTRACTION W/PHACO Left 06/14/2018   Procedure: CATARACT EXTRACTION PHACO AND INTRAOCULAR LENS PLACEMENT (IOC);  Surgeon: Baruch Goldmann, MD;  Location: [facility];  Service: Ophthalmology;  Laterality: Left;  CDE: 4.13  . CATARACT EXTRACTION W/PHACO Right 03/19/2020   Procedure: CATARACT EXTRACTION PHACO AND INTRAOCULAR LENS PLACEMENT (IOC);  Surgeon: Baruch Goldmann, MD;  Location: [facility];  Service: Ophthalmology;  Laterality: Right;  CDE 10.01  . COLONOSCOPY  10/25/07   RMR: normal rectum left sided diverticula  . COLONOSCOPY N/A 04/26/2014   Dr. Gala Romney: There are sigmoid diverticula, 3 mm polyp removed from the descending segment, tubular adenoma.  Next colonoscopy in 7 years  . ESOPHAGOGASTRODUODENOSCOPY  10/25/07   DDU:KGURKYHC esophagogasteic junction focal antral erosions and bular erosions, otherwise normal. mild chronic gastritis.   Marland Kitchen PARATHYROIDECTOMY  1999/2000   removed two   Current Facility-Administered Medications  Medication Dose Route Frequency Provider Last Rate Last Admin  . lactated ringers infusion   Intravenous Continuous Mansouraty, Telford Nab., MD 10 mL/hr at 08/09/20 0713 New Bag at 08/09/20 6237   Allergies  Allergen Reactions  . Codeine Nausea And Vomiting    Headache/ NV  . Sulfa Antibiotics Nausea And Vomiting    Mother was allergic   Family History  Problem Relation Age of Onset  . Colon cancer Paternal Aunt   . Heart disease Paternal Aunt    . Heart disease Other   . Diabetes Mother   . Heart disease Sister   . Skin cancer Brother   . Heart disease Brother   . Heart disease Paternal Uncle   . Esophageal cancer Neg Hx   . Stomach cancer Neg Hx   . Inflammatory bowel disease Neg Hx   . Liver disease Neg Hx   . Pancreatic cancer Neg Hx    Social History   Socioeconomic History  . Marital status: Widowed    Spouse name: Not on file  . Number of children: Not on file  . Years of education: Not on file  . Highest education level: Not on file  Occupational History  . Occupation: retired Pharmacist, hospital  Tobacco Use  . Smoking status: Current Every Day Smoker    Packs/day: 0.25    Years: 40.00    Pack years: 10.00    Types: Cigarettes  . Smokeless tobacco: Never Used  Vaping Use  . Vaping Use: Never used  Substance and Sexual Activity  . Alcohol use: No    Alcohol/week: 0.0 standard drinks  . Drug use: No  . Sexual activity: Yes    Birth control/protection: Surgical    Comment: Hysterectomy  Other Topics Concern  . Not on file  Social History Narrative   Adopted daughter   Social Determinants of Health   Financial Resource Strain: Not on file  Food Insecurity: Not on file  Transportation Needs: Not on file  Physical Activity: Not on file  Stress: Not on  file  Social Connections: Not on file  Intimate Partner Violence: Not on file    Physical Exam: Vital signs in last 24 hours: Temp:  [98.3 F (36.8 C)] 98.3 F (36.8 C) (05/05 0709) Pulse Rate:  [78] 78 (05/05 0709) Resp:  [17] 17 (05/05 0709) BP: (207)/(100) 207/100 (05/05 0709) SpO2:  [97 %] 97 % (05/05 0709) Weight:  [66.7 kg] 66.7 kg (05/04 0915)   GEN: NAD EYE: Sclerae anicteric ENT: MMM CV: Non-tachycardic GI: Soft, NT/ND NEURO:  Alert & Oriented x 3  Lab Results: Recent Labs    08/06/20 1012  WBC 9.2  HGB 15.3*  HCT 44.5  PLT 218.0   BMET Recent Labs    08/06/20 1012  NA 139  K 4.2  CL 103  CO2 26  GLUCOSE 140*  BUN 13   CREATININE 0.73  CALCIUM 10.8*   LFT No results for input(s): PROT, ALBUMIN, AST, ALT, ALKPHOS, BILITOT, BILIDIR, IBILI in the last 72 hours. 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  The risks and benefits of endoscopic evaluation were discussed with the patient; these include but are not limited to the risk of perforation, infection, bleeding, missed lesions, lack of diagnosis, severe illness requiring hospitalization, as well as anesthesia and sedation related illnesses.  The patient is agreeable to proceed.   The risks of an EUS including intestinal perforation, bleeding, infection, aspiration, and medication effects were discussed as was the possibility it may not give a definitive diagnosis if a biopsy is performed.  When a biopsy of the pancreas is done as part of the EUS, there is an additional risk of pancreatitis at the rate of about 1-2%.  It was explained that procedure related pancreatitis is typically mild, although it can be severe and even life threatening, which is why we do not perform random pancreatic biopsies and only biopsy a lesion/area we feel is concerning enough to warrant the risk.    Justice Britain, MD [facility] Advanced [facility]oscopy Office # [phone]

## 2020-08-09 NOTE — Anesthesia Procedure Notes (Signed)
Procedure Name: MAC Date/Time: 08/09/2020 7:36 AM Performed by: Janene Harvey, CRNA Pre-anesthesia Checklist: Patient identified, Emergency Drugs available, Suction available and Patient being monitored Patient Re-evaluated:Patient Re-evaluated prior to induction Oxygen Delivery Method: Nasal cannula Placement Confirmation: positive ETCO2 Dental Injury: Teeth and Oropharynx as per pre-operative assessment

## 2020-08-10 ENCOUNTER — Encounter: Payer: Self-pay | Admitting: Gastroenterology

## 2020-08-10 ENCOUNTER — Encounter (HOSPITAL_COMMUNITY): Payer: Self-pay | Admitting: Gastroenterology

## 2020-08-10 LAB — SURGICAL PATHOLOGY

## 2020-08-13 ENCOUNTER — Telehealth: Payer: Self-pay

## 2020-08-13 MED ORDER — PANCRELIPASE (LIP-PROT-AMYL) 36000-114000 UNITS PO CPEP: ORAL_CAPSULE | ORAL | 11 refills | Status: DC

## 2020-08-13 NOTE — Telephone Encounter (Signed)
-----   Message from Irving Copas., MD sent at 08/09/2020  7:42 AM EDT ----- Regarding: [place], Can you see if we can get patient assistance for Creon for this patient. She stopped getting the PERT this year when insurance changed and it made a significant difference for her. Standard dosing 72K - meal/36K - snack. Thanks for looking into this. GM

## 2020-08-13 NOTE — Telephone Encounter (Signed)
Samples for Creon placed at the front desk for pt.Rx for Creon has been sent to [facility]. Patient assistance form has been sent to the Creon Nurse Ambassador program. Pt has been informed and will come office and pick up samples. I have also placed a patient assistance form with samples for pt.

## 2020-10-09 DIAGNOSIS — E1165 Type 2 diabetes mellitus with hyperglycemia: Secondary | ICD-10-CM | POA: Diagnosis not present

## 2020-10-09 DIAGNOSIS — Z299 Encounter for prophylactic measures, unspecified: Secondary | ICD-10-CM | POA: Diagnosis not present

## 2020-10-09 DIAGNOSIS — E209 Hypoparathyroidism, unspecified: Secondary | ICD-10-CM | POA: Diagnosis not present

## 2020-10-09 DIAGNOSIS — I739 Peripheral vascular disease, unspecified: Secondary | ICD-10-CM | POA: Diagnosis not present

## 2020-10-09 DIAGNOSIS — I10 Essential (primary) hypertension: Secondary | ICD-10-CM | POA: Diagnosis not present

## 2020-10-09 DIAGNOSIS — E1151 Type 2 diabetes mellitus with diabetic peripheral angiopathy without gangrene: Secondary | ICD-10-CM | POA: Diagnosis not present

## 2020-11-16 ENCOUNTER — Telehealth: Payer: Self-pay | Admitting: Gastroenterology

## 2020-11-16 NOTE — Telephone Encounter (Signed)
Patient is due ov with RMR only. See recall.

## 2020-11-19 ENCOUNTER — Encounter: Payer: Self-pay | Admitting: Internal Medicine

## 2020-11-29 ENCOUNTER — Telehealth: Payer: Self-pay | Admitting: Internal Medicine

## 2020-11-29 NOTE — Telephone Encounter (Signed)
RECALL FOR CT CHEST

## 2020-11-29 NOTE — Telephone Encounter (Signed)
Recall sent 

## 2020-12-11 ENCOUNTER — Telehealth: Payer: Self-pay | Admitting: Internal Medicine

## 2020-12-11 DIAGNOSIS — R911 Solitary pulmonary nodule: Secondary | ICD-10-CM

## 2020-12-11 NOTE — Addendum Note (Signed)
Addended by: Cheron Every on: 12/11/2020 09:57 AM   Modules accepted: Orders

## 2020-12-11 NOTE — Telephone Encounter (Signed)
Patient received letter to schedule ct scan

## 2020-12-11 NOTE — Telephone Encounter (Signed)
Called pt and is aware of appt details. She voiced understanding.

## 2020-12-25 ENCOUNTER — Telehealth: Payer: Self-pay

## 2020-12-25 NOTE — Telephone Encounter (Signed)
Tonya at pre-service center called office, CT chest w/o contrast needs PA from [facility]. [phone] ext [phone].  PA for CT chest w/o contrast submitted via [employer] website. Order ID: [id number], valid 12/25/20-01/23/21.   Tried to call Kenney Houseman, LMOVM to inform her of PA.

## 2020-12-27 ENCOUNTER — Other Ambulatory Visit: Payer: Self-pay

## 2020-12-27 ENCOUNTER — Ambulatory Visit (HOSPITAL_COMMUNITY): Payer: BC Managed Care – PPO

## 2020-12-27 ENCOUNTER — Ambulatory Visit (HOSPITAL_COMMUNITY)
Admission: RE | Admit: 2020-12-27 | Discharge: 2020-12-27 | Disposition: A | Discharge status: Home / Self Care | Payer: Medicare Other | Source: Ambulatory Visit | Attending: Gastroenterology | Admitting: Gastroenterology

## 2020-12-27 DIAGNOSIS — R911 Solitary pulmonary nodule: Secondary | ICD-10-CM | POA: Diagnosis not present

## 2020-12-27 DIAGNOSIS — I700 Atherosclerosis of aorta: Secondary | ICD-10-CM | POA: Diagnosis not present

## 2020-12-27 DIAGNOSIS — H524 Presbyopia: Secondary | ICD-10-CM | POA: Diagnosis not present

## 2020-12-27 DIAGNOSIS — Z961 Presence of intraocular lens: Secondary | ICD-10-CM | POA: Diagnosis not present

## 2020-12-27 DIAGNOSIS — E119 Type 2 diabetes mellitus without complications: Secondary | ICD-10-CM | POA: Diagnosis not present

## 2020-12-27 DIAGNOSIS — R918 Other nonspecific abnormal finding of lung field: Secondary | ICD-10-CM | POA: Diagnosis not present

## 2020-12-31 ENCOUNTER — Telehealth: Payer: Self-pay | Admitting: Internal Medicine

## 2020-12-31 NOTE — Telephone Encounter (Signed)
Returned pt's call to relay imaging results.

## 2020-12-31 NOTE — Telephone Encounter (Signed)
Pt returning call. [phone]

## 2021-01-04 DIAGNOSIS — M109 Gout, unspecified: Secondary | ICD-10-CM | POA: Diagnosis not present

## 2021-01-07 ENCOUNTER — Other Ambulatory Visit: Payer: Self-pay

## 2021-01-07 ENCOUNTER — Ambulatory Visit
Admission: RE | Admit: 2021-01-07 | Discharge: 2021-01-07 | Disposition: A | Discharge status: Home / Self Care | Payer: Medicare Other | Source: Ambulatory Visit | Attending: Internal Medicine | Admitting: Internal Medicine

## 2021-01-07 ENCOUNTER — Other Ambulatory Visit: Payer: Self-pay | Admitting: Internal Medicine

## 2021-01-07 DIAGNOSIS — Z1231 Encounter for screening mammogram for malignant neoplasm of breast: Secondary | ICD-10-CM

## 2021-01-07 DIAGNOSIS — E559 Vitamin D deficiency, unspecified: Secondary | ICD-10-CM | POA: Diagnosis not present

## 2021-01-07 DIAGNOSIS — Z0000 Encounter for general adult medical examination without abnormal findings: Secondary | ICD-10-CM | POA: Diagnosis not present

## 2021-01-07 DIAGNOSIS — R5383 Other fatigue: Secondary | ICD-10-CM | POA: Diagnosis not present

## 2021-01-07 DIAGNOSIS — I10 Essential (primary) hypertension: Secondary | ICD-10-CM | POA: Diagnosis not present

## 2021-01-07 DIAGNOSIS — Z299 Encounter for prophylactic measures, unspecified: Secondary | ICD-10-CM | POA: Diagnosis not present

## 2021-01-07 DIAGNOSIS — Z23 Encounter for immunization: Secondary | ICD-10-CM | POA: Diagnosis not present

## 2021-01-07 DIAGNOSIS — E7800 Pure hypercholesterolemia, unspecified: Secondary | ICD-10-CM | POA: Diagnosis not present

## 2021-01-07 DIAGNOSIS — Z7189 Other specified counseling: Secondary | ICD-10-CM | POA: Diagnosis not present

## 2021-01-07 DIAGNOSIS — Z79899 Other long term (current) drug therapy: Secondary | ICD-10-CM | POA: Diagnosis not present

## 2021-01-07 DIAGNOSIS — F17210 Nicotine dependence, cigarettes, uncomplicated: Secondary | ICD-10-CM | POA: Diagnosis not present

## 2021-01-08 ENCOUNTER — Encounter: Payer: Self-pay | Admitting: Internal Medicine

## 2021-01-08 ENCOUNTER — Ambulatory Visit (INDEPENDENT_AMBULATORY_CARE_PROVIDER_SITE_OTHER): Payer: Medicare Other | Admitting: Internal Medicine

## 2021-01-08 VITALS — BP 157/87 | HR 73 | Temp 97.1°F | Ht 63.0 in | Wt 148.4 lb

## 2021-01-08 DIAGNOSIS — K8689 Other specified diseases of pancreas: Secondary | ICD-10-CM

## 2021-01-08 DIAGNOSIS — K219 Gastro-esophageal reflux disease without esophagitis: Secondary | ICD-10-CM

## 2021-01-08 DIAGNOSIS — K8681 Exocrine pancreatic insufficiency: Secondary | ICD-10-CM | POA: Diagnosis not present

## 2021-01-08 NOTE — Progress Notes (Signed)
Done

## 2021-01-08 NOTE — Progress Notes (Signed)
Primary Care Physician:  Monico Blitz, MD Primary Gastroenterologist:  Dr.   Pre-Procedure History & Physical: HPI:  Michele Pace is a 80 y.o. female here for Chronic pancreatitis.  No alcohol exposure.  Gallbladder in situ.  No evidence of biliary etiology.  Mildly elevated calcium along the way.  Calcium supplements stopped ; followed  closely with Dr. Manuella Ghazi. EGD EUS by Dr. Rush Landmark earlier this year demonstrated compelling evidence for chronic pancreatitis without mass being seen.  Multiple small PD stones.  Follow-up MRI of the pancreas in 1 to 2 years given increased risk of pancreatic cancer.  Today, patient tells me she feels great; has not felt this good in many years since being on pancreatic enzyme supplements and PPI.Marland Kitchen  No abdominal pain, no diarrhea.  Appetite described as being good.  She is getting out and about more.  Unfortunately, she lost her brother 2 weeks ago who died suddenly. I do note she is has lost 7 pounds since she was here 1 year ago.  She states she has a good appetite.  Essentially devoid of any GI symptoms.  Patient states she is getting serious about smoking cessation.  She is starting Chantix as she reports.  History of lung nodule for which follow-up CT recommended in 2 years  Past Medical History:  Diagnosis Date   Diabetes ([place])    diet controlled - no meds   GERD (gastroesophageal reflux disease)    Gout    HTN (hypertension)    Hx of adenomatous colonic polyps    Hypercholesteremia     Past Surgical History:  Procedure Laterality Date   ABDOMINAL HYSTERECTOMY     BIOPSY  08/09/2020   Procedure: BIOPSY;  Surgeon: Irving Copas., MD;  Location: [place];  Service: Gastroenterology;;   CATARACT EXTRACTION W/PHACO Left 06/14/2018   Procedure: CATARACT EXTRACTION PHACO AND INTRAOCULAR LENS PLACEMENT ([place]);  Surgeon: Baruch Goldmann, MD;  Location: [facility];  Service: Ophthalmology;  Laterality: Left;  CDE: 4.13   CATARACT EXTRACTION  W/PHACO Right 03/19/2020   Procedure: CATARACT EXTRACTION PHACO AND INTRAOCULAR LENS PLACEMENT ([facility]);  Surgeon: Baruch Goldmann, MD;  Location: [facility];  Service: Ophthalmology;  Laterality: Right;  CDE 10.01   COLONOSCOPY  10/25/07   RMR: normal rectum left sided diverticula   COLONOSCOPY N/A 04/26/2014   Dr. Gala Romney: There are sigmoid diverticula, 3 mm polyp removed from the descending segment, tubular adenoma.  Next colonoscopy in 7 years   ESOPHAGOGASTRODUODENOSCOPY  10/25/07   WHQ:PRFFMBWG esophagogasteic junction focal antral erosions and bular erosions, otherwise normal. mild chronic gastritis.    ESOPHAGOGASTRODUODENOSCOPY (EGD) WITH PROPOFOL N/A 08/09/2020   Procedure: ESOPHAGOGASTRODUODENOSCOPY (EGD) WITH PROPOFOL;  Surgeon: Rush Landmark Telford Nab., MD;  Location: [facility];  Service: Gastroenterology;  Laterality: N/A;   EUS N/A 08/09/2020   Procedure: UPPER ENDOSCOPIC ULTRASOUND (EUS) RADIAL;  Surgeon: Irving Copas., MD;  Location: [place];  Service: Gastroenterology;  Laterality: N/A;   PARATHYROIDECTOMY  1999/2000   removed two    Prior to Admission medications   Medication Sig Start Date End Date Taking? Authorizing Provider  allopurinol (ZYLOPRIM) 300 MG tablet Take 300 mg by mouth daily.    Yes [provider]  amLODipine-benazepril (LOTREL) 5-20 MG per capsule Take 1 capsule by mouth daily.    Yes [provider]  Ascorbic Acid (VITAMIN C) 1000 MG tablet Take 1,000 mg by mouth daily.    Yes [provider]  aspirin 81 MG tablet Take 81 mg  by mouth daily.   Yes [provider]  azelastine (ASTELIN) 0.1 % nasal spray Place 1 spray into both nostrils 2 (two) times daily as needed for rhinitis or allergies. Use in each nostril as directed   Yes [provider]  b complex vitamins tablet Take 1 tablet by mouth daily.   Yes [provider]  calcium citrate (CALCITRATE - DOSED IN MG ELEMENTAL CALCIUM) 950 MG tablet Take  600 mg by mouth daily.   Yes [provider]  Carboxymethylcellulose Sodium (THERATEARS) 0.25 % SOLN Place 1 drop into both eyes daily as needed (Dry eyes).   Yes [provider]  cetirizine (ZYRTEC) 10 MG tablet Take 10 mg by mouth at bedtime.   Yes [provider]  cholecalciferol (VITAMIN D) 25 MCG (1000 UNIT) tablet Take 1,000 Units by mouth daily.    Yes [provider]  Coenzyme Q10 (CO Q 10 PO) Take 200 mg by mouth daily.    Yes [provider]  Cranberry 500 MG TABS Take 500 mg by mouth daily.    Yes [provider]  Cyanocobalamin 2500 MCG TABS Take 2,500 mcg by mouth daily.    Yes [provider]  fluticasone (FLONASE) 50 MCG/ACT nasal spray Place 2 sprays into both nostrils at bedtime.    Yes [provider]  lipase/protease/amylase (CREON) 36000 UNITS CPEP capsule Take 2 capsules with each meal.  Take 1 capsule with each snack( up 2 snacks per day) 08/13/20  Yes Mansouraty, Telford Nab., MD  omega-3 acid ethyl esters (LOVAZA) 1 G capsule Take 1 g by mouth daily.   Yes [provider]  omeprazole (PRILOSEC) 40 MG capsule Take 40 mg by mouth daily.    Yes [provider]  polycarbophil (FIBERCON) 625 MG tablet Take 625 mg by mouth daily.   Yes [provider]  Polyethylene Glycol 3350 (CLEARLAX PO) Take 8.5 g by mouth daily.   Yes [provider]  pravastatin (PRAVACHOL) 20 MG tablet Take 20 mg by mouth at bedtime.    Yes [provider]  Probiotic Product (PROBIOTIC PO) Take 1 tablet by mouth daily.   Yes [provider]  TART CHERRY PO Take 200 mg by mouth daily.   Yes [provider]  Vitamin E 180 MG (400 UNIT) CAPS Take 180 Units by mouth daily.   Yes [provider]  zinc gluconate 50 MG tablet Take 50 mg by mouth daily.   Yes [provider]    Allergies as of 01/08/2021 - Review Complete 01/08/2021  Allergen Reaction Noted    Codeine Nausea And Vomiting    Sulfa antibiotics Nausea And Vomiting 03/09/2020    Family History  Problem Relation Age of Onset   Colon cancer Paternal Aunt    Heart disease Paternal Aunt    Heart disease Other    Diabetes Mother    Heart disease Sister    Skin cancer Brother    Heart disease Brother    Heart disease Paternal Uncle    Esophageal cancer Neg Hx    Stomach cancer Neg Hx    Inflammatory bowel disease Neg Hx    Liver disease Neg Hx    Pancreatic cancer Neg Hx     Social History   Socioeconomic History   Marital status: Widowed    Spouse name: Not on file   Number of children: Not on file   Years of education: Not on file   Highest education level:  Not on file  Occupational History   Occupation: retired Pharmacist, hospital  Tobacco Use   Smoking status: Every Day    Packs/day: 0.25    Years: 40.00    Pack years: 10.00    Types: Cigarettes   Smokeless tobacco: Never  Vaping Use   Vaping Use: Never used  Substance and Sexual Activity   Alcohol use: No    Alcohol/week: 0.0 standard drinks   Drug use: No   Sexual activity: Yes    Birth control/protection: Surgical    Comment: Hysterectomy  Other Topics Concern   Not on file  Social History Narrative   Adopted daughter   Social Determinants of Health   Financial Resource Strain: Not on file  Food Insecurity: Not on file  Transportation Needs: Not on file  Physical Activity: Not on file  Stress: Not on file  Social Connections: Not on file  Intimate Partner Violence: Not on file    Review of Systems: See HPI, otherwise negative ROS  Physical Exam: BP (!) 157/87   Pulse 73   Temp (!) 97.1 F (36.2 C) (Temporal)   Ht 5\' 3"  (1.6 m)   Wt 148 lb 6.4 oz (67.3 kg)   LMP  (LMP Unknown)   BMI 26.29 kg/m  General:   Alert,  , pleasant and cooperative in NAD SNeck:  Supple; no masses or thyromegaly. No significant cervical adenopathy. Lungs:  Clear throughout to auscultation.   No wheezes, crackles, or  rhonchi. No acute distress. Heart:  Regular rate and rhythm; no murmurs, clicks, rubs,  or gallops. Abdomen: Non-distended, normal bowel sounds.  Soft and nontender without appreciable mass or hepatosplenomegaly.  Pulses:  Normal pulses noted. Extremities:  Without clubbing or edema.  Impression/Plan: 80 year old lady with idiopathic chronic pancreatitis/PD stones as documented on EUS earlier this year.  Clinically, dramatically improved on pancreatic enzyme supplements/PPI. History of mild hypercalcemia and history of parathyroid surgery.   Gallbladder remains in situ.  I suppose microlithiasis is not absolutely excluded as a contributing factor.  Recommendations:  Continue Creon and omeprazole daily without fail  Fully encourage you to completely stop smoking  Office visit here in 1 year and as needed  Plan for an MRI of your pancreas to be done when you return here in 1 year  Repeat chest CT in 2 years from now.  Notice: This dictation was prepared with Dragon dictation along with smaller phrase technology. Any transcriptional errors that result from this process are unintentional and may not be corrected upon review.

## 2021-01-08 NOTE — Patient Instructions (Signed)
It was good to see you again today!  Continue Creon and omeprazole daily without fail  Fully encourage you to completely stop smoking  Office visit here in 1 year and as needed  Plan for an MRI of your pancreas to be done when you return here in 1 year

## 2021-01-22 DIAGNOSIS — Z299 Encounter for prophylactic measures, unspecified: Secondary | ICD-10-CM | POA: Diagnosis not present

## 2021-01-22 DIAGNOSIS — E1151 Type 2 diabetes mellitus with diabetic peripheral angiopathy without gangrene: Secondary | ICD-10-CM | POA: Diagnosis not present

## 2021-01-22 DIAGNOSIS — I10 Essential (primary) hypertension: Secondary | ICD-10-CM | POA: Diagnosis not present

## 2021-01-22 DIAGNOSIS — D692 Other nonthrombocytopenic purpura: Secondary | ICD-10-CM | POA: Diagnosis not present

## 2021-01-22 DIAGNOSIS — E1165 Type 2 diabetes mellitus with hyperglycemia: Secondary | ICD-10-CM | POA: Diagnosis not present

## 2021-03-06 DIAGNOSIS — M109 Gout, unspecified: Secondary | ICD-10-CM | POA: Diagnosis not present

## 2021-04-24 ENCOUNTER — Encounter: Payer: Self-pay | Admitting: *Deleted

## 2021-05-03 DIAGNOSIS — Z299 Encounter for prophylactic measures, unspecified: Secondary | ICD-10-CM | POA: Diagnosis not present

## 2021-05-03 DIAGNOSIS — I10 Essential (primary) hypertension: Secondary | ICD-10-CM | POA: Diagnosis not present

## 2021-05-03 DIAGNOSIS — E1151 Type 2 diabetes mellitus with diabetic peripheral angiopathy without gangrene: Secondary | ICD-10-CM | POA: Diagnosis not present

## 2021-05-03 DIAGNOSIS — E1129 Type 2 diabetes mellitus with other diabetic kidney complication: Secondary | ICD-10-CM | POA: Diagnosis not present

## 2021-05-03 DIAGNOSIS — Z87891 Personal history of nicotine dependence: Secondary | ICD-10-CM | POA: Diagnosis not present

## 2021-05-03 DIAGNOSIS — E1165 Type 2 diabetes mellitus with hyperglycemia: Secondary | ICD-10-CM | POA: Diagnosis not present

## 2021-05-03 DIAGNOSIS — R809 Proteinuria, unspecified: Secondary | ICD-10-CM | POA: Diagnosis not present

## 2021-08-01 DIAGNOSIS — I10 Essential (primary) hypertension: Secondary | ICD-10-CM | POA: Diagnosis not present

## 2021-08-01 DIAGNOSIS — Z299 Encounter for prophylactic measures, unspecified: Secondary | ICD-10-CM | POA: Diagnosis not present

## 2021-08-01 DIAGNOSIS — E7800 Pure hypercholesterolemia, unspecified: Secondary | ICD-10-CM | POA: Diagnosis not present

## 2021-08-01 DIAGNOSIS — Z79899 Other long term (current) drug therapy: Secondary | ICD-10-CM | POA: Diagnosis not present

## 2021-08-01 DIAGNOSIS — Z0000 Encounter for general adult medical examination without abnormal findings: Secondary | ICD-10-CM | POA: Diagnosis not present

## 2021-08-01 DIAGNOSIS — E1165 Type 2 diabetes mellitus with hyperglycemia: Secondary | ICD-10-CM | POA: Diagnosis not present

## 2021-09-16 DIAGNOSIS — Z87891 Personal history of nicotine dependence: Secondary | ICD-10-CM | POA: Diagnosis not present

## 2021-09-16 DIAGNOSIS — I10 Essential (primary) hypertension: Secondary | ICD-10-CM | POA: Diagnosis not present

## 2021-09-16 DIAGNOSIS — E7800 Pure hypercholesterolemia, unspecified: Secondary | ICD-10-CM | POA: Diagnosis not present

## 2021-09-16 DIAGNOSIS — Z299 Encounter for prophylactic measures, unspecified: Secondary | ICD-10-CM | POA: Diagnosis not present

## 2021-09-16 DIAGNOSIS — E1165 Type 2 diabetes mellitus with hyperglycemia: Secondary | ICD-10-CM | POA: Diagnosis not present

## 2021-09-16 DIAGNOSIS — M109 Gout, unspecified: Secondary | ICD-10-CM | POA: Diagnosis not present

## 2021-12-18 ENCOUNTER — Other Ambulatory Visit: Payer: Self-pay | Admitting: Internal Medicine

## 2021-12-18 DIAGNOSIS — Z1231 Encounter for screening mammogram for malignant neoplasm of breast: Secondary | ICD-10-CM

## 2021-12-23 DIAGNOSIS — E1151 Type 2 diabetes mellitus with diabetic peripheral angiopathy without gangrene: Secondary | ICD-10-CM | POA: Diagnosis not present

## 2021-12-23 DIAGNOSIS — I10 Essential (primary) hypertension: Secondary | ICD-10-CM | POA: Diagnosis not present

## 2021-12-23 DIAGNOSIS — Z299 Encounter for prophylactic measures, unspecified: Secondary | ICD-10-CM | POA: Diagnosis not present

## 2021-12-23 DIAGNOSIS — E1165 Type 2 diabetes mellitus with hyperglycemia: Secondary | ICD-10-CM | POA: Diagnosis not present

## 2021-12-25 ENCOUNTER — Telehealth: Payer: Self-pay | Admitting: *Deleted

## 2021-12-25 ENCOUNTER — Encounter: Payer: Self-pay | Admitting: *Deleted

## 2021-12-25 NOTE — Telephone Encounter (Signed)
On recall for 1 yr MRI pancreas

## 2021-12-25 NOTE — Telephone Encounter (Signed)
Letter mailed

## 2022-01-14 DIAGNOSIS — E7800 Pure hypercholesterolemia, unspecified: Secondary | ICD-10-CM | POA: Diagnosis not present

## 2022-01-14 DIAGNOSIS — Z79899 Other long term (current) drug therapy: Secondary | ICD-10-CM | POA: Diagnosis not present

## 2022-01-14 DIAGNOSIS — I10 Essential (primary) hypertension: Secondary | ICD-10-CM | POA: Diagnosis not present

## 2022-01-14 DIAGNOSIS — Z23 Encounter for immunization: Secondary | ICD-10-CM | POA: Diagnosis not present

## 2022-01-14 DIAGNOSIS — E559 Vitamin D deficiency, unspecified: Secondary | ICD-10-CM | POA: Diagnosis not present

## 2022-01-14 DIAGNOSIS — Z87891 Personal history of nicotine dependence: Secondary | ICD-10-CM | POA: Diagnosis not present

## 2022-01-14 DIAGNOSIS — R5383 Other fatigue: Secondary | ICD-10-CM | POA: Diagnosis not present

## 2022-01-14 DIAGNOSIS — Z0000 Encounter for general adult medical examination without abnormal findings: Secondary | ICD-10-CM | POA: Diagnosis not present

## 2022-01-14 DIAGNOSIS — Z299 Encounter for prophylactic measures, unspecified: Secondary | ICD-10-CM | POA: Diagnosis not present

## 2022-01-15 ENCOUNTER — Inpatient Hospital Stay: Admission: RE | Admit: 2022-01-15 | Payer: BC Managed Care – PPO | Source: Ambulatory Visit

## 2022-01-17 ENCOUNTER — Telehealth: Payer: Self-pay | Admitting: Gastroenterology

## 2022-01-17 NOTE — Telephone Encounter (Signed)
Patient is requesting a discounted price of creon that was prescribed by Dr.Mansouraty. She is requesting a call back.

## 2022-01-17 NOTE — Telephone Encounter (Signed)
Called and left message for Mrs.Hintze. Will try to contact patient back on Monday to see how we can help with Creon.

## 2022-01-22 NOTE — Telephone Encounter (Signed)
I spoke with Michele Pace and told her unfortunately we don't have any samples. We have printed out an [place] assistance program form. She will come tomorrow to pick this up. Also a form is being faxed to the Creon Nurse Ambassador program.

## 2022-01-24 NOTE — Telephone Encounter (Signed)
Patient is requesting paperwork mailed to her, her sister fell and she is unable to make the drive as she lives in [place].

## 2022-01-24 NOTE — Telephone Encounter (Signed)
Return call to patient to let her know that patient assistance form for Creon will be mailed to her. Pt voiced understanding.

## 2022-03-11 DIAGNOSIS — E119 Type 2 diabetes mellitus without complications: Secondary | ICD-10-CM | POA: Diagnosis not present

## 2022-03-11 DIAGNOSIS — Z7984 Long term (current) use of oral hypoglycemic drugs: Secondary | ICD-10-CM | POA: Diagnosis not present

## 2022-03-11 DIAGNOSIS — Z961 Presence of intraocular lens: Secondary | ICD-10-CM | POA: Diagnosis not present

## 2022-03-17 DIAGNOSIS — E1165 Type 2 diabetes mellitus with hyperglycemia: Secondary | ICD-10-CM | POA: Diagnosis not present

## 2022-03-17 DIAGNOSIS — Z299 Encounter for prophylactic measures, unspecified: Secondary | ICD-10-CM | POA: Diagnosis not present

## 2022-03-17 DIAGNOSIS — I10 Essential (primary) hypertension: Secondary | ICD-10-CM | POA: Diagnosis not present

## 2022-03-30 DIAGNOSIS — N390 Urinary tract infection, site not specified: Secondary | ICD-10-CM | POA: Diagnosis not present

## 2022-03-30 DIAGNOSIS — R350 Frequency of micturition: Secondary | ICD-10-CM | POA: Diagnosis not present

## 2022-05-05 ENCOUNTER — Telehealth: Payer: Self-pay | Admitting: Gastroenterology

## 2022-05-05 ENCOUNTER — Other Ambulatory Visit: Payer: Self-pay

## 2022-05-05 DIAGNOSIS — N390 Urinary tract infection, site not specified: Secondary | ICD-10-CM | POA: Diagnosis not present

## 2022-05-05 DIAGNOSIS — I700 Atherosclerosis of aorta: Secondary | ICD-10-CM | POA: Diagnosis not present

## 2022-05-05 DIAGNOSIS — E1165 Type 2 diabetes mellitus with hyperglycemia: Secondary | ICD-10-CM | POA: Diagnosis not present

## 2022-05-05 DIAGNOSIS — R350 Frequency of micturition: Secondary | ICD-10-CM | POA: Diagnosis not present

## 2022-05-05 DIAGNOSIS — Z299 Encounter for prophylactic measures, unspecified: Secondary | ICD-10-CM | POA: Diagnosis not present

## 2022-05-05 MED ORDER — PANCRELIPASE (LIP-PROT-AMYL) 36000-114000 UNITS PO CPEP: ORAL_CAPSULE | ORAL | 11 refills | Status: DC

## 2022-05-05 NOTE — Progress Notes (Signed)
Rx printed and manually faxed to [facility].

## 2022-05-05 NOTE — Telephone Encounter (Signed)
Prescription for Creon sent to [facility]. Patient has been informed.

## 2022-05-05 NOTE — Telephone Encounter (Signed)
Inbound call from patient stating that she got a new insurance Physicist, medical) and is requesting a refill of Creon be sent to [facility]. Please advise.

## 2022-05-23 DIAGNOSIS — E209 Hypoparathyroidism, unspecified: Secondary | ICD-10-CM | POA: Diagnosis not present

## 2022-05-23 DIAGNOSIS — I10 Essential (primary) hypertension: Secondary | ICD-10-CM | POA: Diagnosis not present

## 2022-05-23 DIAGNOSIS — E1165 Type 2 diabetes mellitus with hyperglycemia: Secondary | ICD-10-CM | POA: Diagnosis not present

## 2022-05-23 DIAGNOSIS — Z87891 Personal history of nicotine dependence: Secondary | ICD-10-CM | POA: Diagnosis not present

## 2022-05-23 DIAGNOSIS — N390 Urinary tract infection, site not specified: Secondary | ICD-10-CM | POA: Diagnosis not present

## 2022-05-23 DIAGNOSIS — Z6826 Body mass index (BMI) 26.0-26.9, adult: Secondary | ICD-10-CM | POA: Diagnosis not present

## 2022-05-23 DIAGNOSIS — R350 Frequency of micturition: Secondary | ICD-10-CM | POA: Diagnosis not present

## 2022-05-23 DIAGNOSIS — Z299 Encounter for prophylactic measures, unspecified: Secondary | ICD-10-CM | POA: Diagnosis not present

## 2022-06-11 ENCOUNTER — Ambulatory Visit: Payer: Medicare PPO

## 2022-06-12 ENCOUNTER — Other Ambulatory Visit: Payer: Self-pay

## 2022-06-12 MED ORDER — PANCRELIPASE (LIP-PROT-AMYL) 36000-114000 UNITS PO CPEP: ORAL_CAPSULE | ORAL | 11 refills | Status: DC

## 2022-06-12 NOTE — Progress Notes (Signed)
Prescription sent to [facility]- per fax request from [place].

## 2022-06-23 DIAGNOSIS — E1129 Type 2 diabetes mellitus with other diabetic kidney complication: Secondary | ICD-10-CM | POA: Diagnosis not present

## 2022-06-23 DIAGNOSIS — I10 Essential (primary) hypertension: Secondary | ICD-10-CM | POA: Diagnosis not present

## 2022-06-23 DIAGNOSIS — Z299 Encounter for prophylactic measures, unspecified: Secondary | ICD-10-CM | POA: Diagnosis not present

## 2022-06-23 DIAGNOSIS — R350 Frequency of micturition: Secondary | ICD-10-CM | POA: Diagnosis not present

## 2022-06-23 DIAGNOSIS — N390 Urinary tract infection, site not specified: Secondary | ICD-10-CM | POA: Diagnosis not present

## 2022-07-03 DIAGNOSIS — I10 Essential (primary) hypertension: Secondary | ICD-10-CM | POA: Diagnosis not present

## 2022-07-03 DIAGNOSIS — Z299 Encounter for prophylactic measures, unspecified: Secondary | ICD-10-CM | POA: Diagnosis not present

## 2022-07-03 DIAGNOSIS — N390 Urinary tract infection, site not specified: Secondary | ICD-10-CM | POA: Diagnosis not present

## 2022-07-03 DIAGNOSIS — R350 Frequency of micturition: Secondary | ICD-10-CM | POA: Diagnosis not present

## 2022-07-23 DIAGNOSIS — R350 Frequency of micturition: Secondary | ICD-10-CM | POA: Diagnosis not present

## 2022-07-23 DIAGNOSIS — N390 Urinary tract infection, site not specified: Secondary | ICD-10-CM | POA: Diagnosis not present

## 2022-07-23 DIAGNOSIS — Z299 Encounter for prophylactic measures, unspecified: Secondary | ICD-10-CM | POA: Diagnosis not present

## 2022-07-23 DIAGNOSIS — I10 Essential (primary) hypertension: Secondary | ICD-10-CM | POA: Diagnosis not present

## 2022-07-31 ENCOUNTER — Encounter: Payer: Self-pay | Admitting: Internal Medicine

## 2022-08-04 DIAGNOSIS — Z7189 Other specified counseling: Secondary | ICD-10-CM | POA: Diagnosis not present

## 2022-08-04 DIAGNOSIS — Z0000 Encounter for general adult medical examination without abnormal findings: Secondary | ICD-10-CM | POA: Diagnosis not present

## 2022-08-04 DIAGNOSIS — Z1331 Encounter for screening for depression: Secondary | ICD-10-CM | POA: Diagnosis not present

## 2022-08-04 DIAGNOSIS — Z299 Encounter for prophylactic measures, unspecified: Secondary | ICD-10-CM | POA: Diagnosis not present

## 2022-08-04 DIAGNOSIS — E7800 Pure hypercholesterolemia, unspecified: Secondary | ICD-10-CM | POA: Diagnosis not present

## 2022-08-04 DIAGNOSIS — R5383 Other fatigue: Secondary | ICD-10-CM | POA: Diagnosis not present

## 2022-08-04 DIAGNOSIS — Z1339 Encounter for screening examination for other mental health and behavioral disorders: Secondary | ICD-10-CM | POA: Diagnosis not present

## 2022-08-04 DIAGNOSIS — Z79899 Other long term (current) drug therapy: Secondary | ICD-10-CM | POA: Diagnosis not present

## 2022-08-04 DIAGNOSIS — I10 Essential (primary) hypertension: Secondary | ICD-10-CM | POA: Diagnosis not present

## 2022-09-05 ENCOUNTER — Encounter: Payer: Self-pay | Admitting: Urology

## 2022-09-05 ENCOUNTER — Ambulatory Visit: Payer: Medicare HMO | Admitting: Urology

## 2022-09-05 VITALS — BP 166/93 | HR 89

## 2022-09-05 DIAGNOSIS — Z87440 Personal history of urinary (tract) infections: Secondary | ICD-10-CM

## 2022-09-05 DIAGNOSIS — N390 Urinary tract infection, site not specified: Secondary | ICD-10-CM

## 2022-09-05 DIAGNOSIS — N3941 Urge incontinence: Secondary | ICD-10-CM

## 2022-09-05 DIAGNOSIS — N3281 Overactive bladder: Secondary | ICD-10-CM | POA: Diagnosis not present

## 2022-09-05 LAB — MICROSCOPIC EXAMINATION
Bacteria, UA: NONE SEEN
RBC, Urine: NONE SEEN /hpf (ref 0–2)

## 2022-09-05 LAB — URINALYSIS, ROUTINE W REFLEX MICROSCOPIC
Bilirubin, UA: NEGATIVE
Glucose, UA: NEGATIVE
Ketones, UA: NEGATIVE
Nitrite, UA: NEGATIVE
Protein,UA: NEGATIVE
RBC, UA: NEGATIVE
Specific Gravity, UA: 1.01 (ref 1.005–1.030)
Urobilinogen, Ur: 0.2 mg/dL (ref 0.2–1.0)
pH, UA: 5.5 (ref 5.0–7.5)

## 2022-09-05 LAB — BLADDER SCAN AMB NON-IMAGING: Scan Result: 175

## 2022-09-05 MED ORDER — TAMSULOSIN HCL 0.4 MG PO CAPS: 0.4000 mg | ORAL_CAPSULE | Freq: Every day | ORAL | 11 refills | Status: DC

## 2022-09-05 NOTE — Progress Notes (Signed)
09/05/2022 10:45 AM   Michele Pace Mar 01, 1941 [id number]  Referring provider: Kirstie Peri, MD [address] [place],  [place] [address]  Recurrent UTI and urinary incontinence   HPI: Michele Pace is a 82yo here for evaluation of Recurrent UTI and urinary incontinence. She has had 3 UTIs in the past 5 months. She has urge incontinence and uses 1-2 liners per day. She has nocturia 1x. Urine stream is intermittently weak.    PMH: Past Medical History:  Diagnosis Date   Diabetes ([facility])    diet controlled - no meds   GERD (gastroesophageal reflux disease)    Gout    HTN (hypertension)    Hx of adenomatous colonic polyps    Hypercholesteremia     Surgical History: Past Surgical History:  Procedure Laterality Date   ABDOMINAL HYSTERECTOMY     BIOPSY  08/09/2020   Procedure: BIOPSY;  Surgeon: Lemar Lofty., MD;  Location: [facility];  Service: Gastroenterology;;   CATARACT EXTRACTION W/PHACO Left 06/14/2018   Procedure: CATARACT EXTRACTION PHACO AND INTRAOCULAR LENS PLACEMENT ([facility]);  Surgeon: Fabio Pierce, MD;  Location[facility];  Service: Ophthalmology;  Laterality: Left;  CDE: 4.13   CATARACT EXTRACTION W/PHACO Right 03/19/2020   Procedure: CATARACT EXTRACTION PHACO AND INTRAOCULAR LENS PLACEMENT ([facility]);  Surgeon: Fabio Pierce, MD;  [facility];  Service: Ophthalmology;  Laterality: Right;  CDE 10.01   COLONOSCOPY  10/25/07   RMR: normal rectum left sided diverticula   COLONOSCOPY N/A 04/26/2014   Dr. Jena Gauss: There are sigmoid diverticula, 3 mm polyp removed from the descending segment, tubular adenoma.  Next colonoscopy in 7 years   ESOPHAGOGASTRODUODENOSCOPY  10/25/07   QIO:NGEXBMWU esophagogasteic junction focal antral erosions and bular erosions, otherwise normal. mild chronic gastritis.    ESOPHAGOGASTRODUODENOSCOPY (EGD) WITH PROPOFOL N/A 08/09/2020   Procedure: ESOPHAGOGASTRODUODENOSCOPY (EGD) WITH PROPOFOL;  Surgeon: Meridee Score Netty Starring., MD;  Location: [facility] END[facility]COPY;   Service: Gastroenterology;  Laterality: N/A;   EUS N/A 08/09/2020   Procedure: UPPER ENDOSCOPIC ULTRASOUND (EUS) RADIAL;  Surgeon: Lemar Lofty., MD;  Location: [facility];  Service: Gastroenterology;  Laterality: N/A;   PARATHYROIDECTOMY  1999/2000   removed two    Home Medications:  Allergies as of 09/05/2022       Reactions   Codeine Nausea And Vomiting   Headache/ NV   Sulfa Antibiotics Nausea And Vomiting   Mother was allergic        Medication List        Accurate as of Sep 05, 2022 10:45 AM. If you have any questions, ask your nurse or doctor.          allopurinol 300 MG tablet Commonly known as: ZYLOPRIM Take 300 mg by mouth daily.   amLODipine-benazepril 5-20 MG capsule Commonly known as: LOTREL Take 1 capsule by mouth daily.   aspirin 81 MG tablet Take 81 mg by mouth daily.   azelastine 0.1 % nasal spray Commonly known as: ASTELIN Place 1 spray into both nostrils 2 (two) times daily as needed for rhinitis or allergies. Use in each nostril as directed   b complex vitamins tablet Take 1 tablet by mouth daily.   calcium citrate 950 (200 Ca) MG tablet Commonly known as: CALCITRATE - dosed in mg elemental calcium Take 600 mg by mouth daily.   cetirizine 10 MG tablet Commonly known as: ZYRTEC Take 10 mg by mouth at bedtime.   cholecalciferol 25 MCG (1000 UNIT) tablet Commonly known as: VITAMIN D3 Take 1,000 Units by  mouth daily.   CLEARLAX PO Take 8.5 g by mouth daily.   CO Q 10 PO Take 200 mg by mouth daily.   Cranberry 500 MG Tabs Take 500 mg by mouth daily.   Cyanocobalamin 2500 MCG Tabs Take 2,500 mcg by mouth daily.   fluticasone 50 MCG/ACT nasal spray Commonly known as: FLONASE Place 2 sprays into both nostrils at bedtime.   lipase/protease/amylase 96045 UNITS Cpep capsule Commonly known as: Creon Take 2 capsules with each meal. Take 1 capsule with each snack( up 2 snacks per day)   omega-3 acid ethyl esters 1 g  capsule Commonly known as: LOVAZA Take 1 g by mouth daily.   omeprazole 40 MG capsule Commonly known as: PRILOSEC Take 40 mg by mouth daily.   polycarbophil 625 MG tablet Commonly known as: FIBERCON Take 625 mg by mouth daily.   pravastatin 20 MG tablet Commonly known as: PRAVACHOL Take 20 mg by mouth at bedtime.   PROBIOTIC PO Take 1 tablet by mouth daily.   TART CHERRY PO Take 200 mg by mouth daily.   Theratears 0.25 % Soln Generic drug: Carboxymethylcellulose Sodium Place 1 drop into both eyes daily as needed (Dry eyes).   vitamin C 1000 MG tablet Take 1,000 mg by mouth daily.   Vitamin E 180 MG (400 UNIT) Caps Take 180 Units by mouth daily.   zinc gluconate 50 MG tablet Take 50 mg by mouth daily.        Allergies:  Allergies  Allergen Reactions   Codeine Nausea And Vomiting    Headache/ NV   Sulfa Antibiotics Nausea And Vomiting    Mother was allergic    Family History: Family History  Problem Relation Age of Onset   Colon cancer Paternal Aunt    Heart disease Paternal Aunt    Heart disease Other    Diabetes Mother    Heart disease Sister    Skin cancer Brother    Heart disease Brother    Heart disease Paternal Uncle    Esophageal cancer Neg Hx    Stomach cancer Neg Hx    Inflammatory bowel disease Neg Hx    Liver disease Neg Hx    Pancreatic cancer Neg Hx     Social History:  reports that she has been smoking cigarettes. She has a 10.00 pack-year smoking history. She has never used smokeless tobacco. She reports that she does not drink alcohol and does not use drugs.  ROS: All other review of systems were reviewed and are negative except what is noted above in HPI  Physical Exam: BP (!) 166/93   Pulse 89   LMP  (LMP Unknown)   Constitutional:  Alert and oriented, No acute distress. HEENT: [place] AT, moist mucus membranes.  Trachea midline, no masses. Cardiovascular: No clubbing, cyanosis, or edema. Respiratory: Normal respiratory effort,  no increased work of breathing. GI: Abdomen is soft, nontender, nondistended, no abdominal masses GU: No CVA tenderness.  Lymph: No cervical or inguinal lymphadenopathy. Skin: No rashes, bruises or suspicious lesions. Neurologic: Grossly intact, no focal deficits, moving all 4 extremities. Psychiatric: Normal mood and affect.  Laboratory Data: Lab Results  Component Value Date   WBC 9.2 08/06/2020   HGB 15.3 (H) 08/06/2020   HCT 44.5 08/06/2020   MCV 95.9 08/06/2020   PLT 218.0 08/06/2020    Lab Results  Component Value Date   CREATININE 0.73 08/06/2020    No results found for: "PSA"  No results found for: "TESTOSTERONE"  Lab Results  Component Value Date   HGBA1C 6.4 (H) 03/15/2020    Urinalysis    Component Value Date/Time   COLORURINE YELLOW 12/07/2019 0000   APPEARANCEUR CLEAR 12/07/2019 0000   LABSPEC 1.006 12/07/2019 0000   PHURINE 6.0 12/07/2019 0000   GLUCOSEU NEGATIVE 12/07/2019 0000   HGBUR NEGATIVE 12/07/2019 0000   KETONESUR NEGATIVE 12/07/2019 0000   PROTEINUR NEGATIVE 12/07/2019 0000    Lab Results  Component Value Date   BACTERIA NONE SEEN 12/07/2019    Pertinent Imaging:  No results found for this or any previous visit.  No results found for this or any previous visit.  No results found for this or any previous visit.  No results found for this or any previous visit.  No results found for this or any previous visit.  No valid procedures specified. No results found for this or any previous visit.  No results found for this or any previous visit.   Assessment & Plan:    1. Recurrent UTI --We discussed the natural hx of recurrent UTIs and the various causes. We discussed the treatment options including post coital prophylaxis, daily prophylaxis, topical estrogen therapy.  - Urinalysis, Routine w reflex microscopic - BLADDER SCAN AMB NON-IMAGING  2. OAB (overactive bladder) -We will trial flomax 0.4mg  for incomplete emptying.     No follow-ups on file.  Wilkie Aye, MD  [facility] [place]

## 2022-09-05 NOTE — Progress Notes (Signed)
post void residual=175 

## 2022-09-05 NOTE — Patient Instructions (Signed)
Acute Urinary Retention, Female  Acute urinary retention is a condition in which a person is unable to pass urine or can only pass a little urine. This condition can happen suddenly and last for a short time. If left untreated, it can become long-term (chronic) and result in kidney damage or other serious complications. What are the causes? This condition may be caused by: Obstruction or narrowing of the tube that drains the bladder (urethra). This may be caused by surgery, problems with nearby organs, or injury to the bladder or urethra. Problems with the nerves in the bladder. Pelvic organ prolapse. Tumors in the area of the pelvis, bladder, or urethra. Vaginal childbirth. Bladder or urinary tract infection. Constipation. Certain medicines. What increases the risk? This condition is more likely to develop in women over age 73. Other chronic health conditions can increase the risk of acute urinary retention. These include: Diseases such as multiple sclerosis. Spinal cord injuries. Diabetes. Degenerative cognitive conditions, such as delirium or dementia. Psychological conditions. A woman may hold her urine due to trauma or because she does not want to use the bathroom. History of preexisting urinary retention. History of prior pelvic surgery, incontinence surgery, or radical pelvic surgery. What are the signs or symptoms? Symptoms of this condition include: Trouble urinating. Pain in the lower abdomen. How is this diagnosed? This condition is diagnosed based on a physical exam and your medical history. You may also have other tests, including: An ultrasound of the bladder or kidneys or both. Blood tests. A urine analysis. Additional tests may be needed, such as a CT scan, MRI, and kidney or bladder function tests. How is this treated? Treatment for this condition may include: Medicines. Placing a thin, sterile tube (catheter) into the bladder to drain urine out of the body. This  is called an indwelling urinary catheter. After it is inserted, the catheter is held in place with a small balloon that is filled with sterile water. Urine drains from the catheter into a collection bag outside of the body. Behavioral therapy. Treatment for other conditions. If needed, you may be treated in the hospital for kidney function problems or to manage other complications. Follow these instructions at home: Medicines Take over-the-counter and prescription medicines only as told by your health care provider. Avoid certain medicines, such as decongestants, antihistamines, and some prescription medicines. Do not take any medicine unless your health care provider approves. If you were prescribed an antibiotic medicine, take it as told by your health care provider. Do not stop using the antibiotic even if you start to feel better. General instructions Do not use any products that contain nicotine or tobacco. These products include cigarettes, chewing tobacco, and vaping devices, such as e-cigarettes. If you need help quitting, ask your health care provider. Drink enough fluid to keep your urine pale yellow. If you have an indwelling urinary catheter, follow the instructions from your health care provider. Monitor any changes in your symptoms. Tell your health care provider about any changes. If instructed, monitor your blood pressure at home. Report changes as told by your health care provider. Keep all follow-up visits. This is important. Contact a health care provider if: You have uncomfortable bladder contractions that you cannot control (spasms). You leak urine with the spasms. Get help right away if: You have chills or a fever. You have blood in your urine. You have a catheter and the following happens: Your catheter stops draining urine. Your catheter falls out. Summary Acute urinary retention is a  condition in which a person is unable to pass urine or can only pass a little urine.  If left untreated, this can result in kidney damage or other serious complications. One cause of this condition may be obstruction or narrowing of the tube that drains the bladder (urethra). This may be caused by surgery, problems with nearby organs, or injury to the bladder or urethra. Treatment may include medicines and placement of an indwelling urinary catheter. Monitor any changes in your symptoms. Tell your health care provider about any changes. This information is not intended to replace advice given to you by your health care provider. Make sure you discuss any questions you have with your health care provider. Document Revised: 12/14/2019 Document Reviewed: 12/14/2019 Elsevier Patient Education  2024 [employer].

## 2022-09-07 NOTE — Progress Notes (Unsigned)
GI Office Note    Referring Provider: Kirstie Peri, MD Primary Care Physician:  Kirstie Peri, MD  Primary Gastroenterologist: Roetta Sessions, MD   Chief Complaint   No chief complaint on file.   History of Present Illness   Michele Pace is a 82 y.o. female presenting today for follow up. Last seen in 01/2021. H/o chronic pancreatitis, no etoh exposure. Gallbladder in situ. No evidence of biliary etiology. Mildly elevated calcium along the way, calcium supplements stopped.   EGD/EUS 08/2020, by Dr. Meridee Score demonstrated compelling evidence for chronic pancreatitis without mass being seen.  Multiple small PD stones.  Follow-up MRI of the pancreas in 1 to 2 years given increased risk of pancreatic cancer.           Medications   Current Outpatient Medications  Medication Sig Dispense Refill   allopurinol (ZYLOPRIM) 300 MG tablet Take 300 mg by mouth daily.      amLODipine-benazepril (LOTREL) 5-20 MG per capsule Take 1 capsule by mouth daily.      Ascorbic Acid (VITAMIN C) 1000 MG tablet Take 1,000 mg by mouth daily.      aspirin 81 MG tablet Take 81 mg by mouth daily.     azelastine (ASTELIN) 0.1 % nasal spray Place 1 spray into both nostrils 2 (two) times daily as needed for rhinitis or allergies. Use in each nostril as directed (Patient not taking: Reported on 09/05/2022)     b complex vitamins tablet Take 1 tablet by mouth daily.     calcium citrate (CALCITRATE - DOSED IN MG ELEMENTAL CALCIUM) 950 MG tablet Take 600 mg by mouth daily.     Carboxymethylcellulose Sodium (THERATEARS) 0.25 % SOLN Place 1 drop into both eyes daily as needed (Dry eyes).     cetirizine (ZYRTEC) 10 MG tablet Take 10 mg by mouth at bedtime.     cholecalciferol (VITAMIN D) 25 MCG (1000 UNIT) tablet Take 1,000 Units by mouth daily.      Coenzyme Q10 (CO Q 10 PO) Take 200 mg by mouth daily.      Cranberry 500 MG TABS Take 500 mg by mouth daily.      Cyanocobalamin 2500 MCG TABS Take 2,500 mcg by  mouth daily.  (Patient not taking: Reported on 09/05/2022)     fluticasone (FLONASE) 50 MCG/ACT nasal spray Place 2 sprays into both nostrils at bedtime.      lipase/protease/amylase (CREON) 36000 UNITS CPEP capsule Take 2 capsules with each meal. Take 1 capsule with each snack( up 2 snacks per day) 300 capsule 11   omega-3 acid ethyl esters (LOVAZA) 1 G capsule Take 1 g by mouth daily.     omeprazole (PRILOSEC) 40 MG capsule Take 40 mg by mouth daily.      polycarbophil (FIBERCON) 625 MG tablet Take 625 mg by mouth daily.     Polyethylene Glycol 3350 (CLEARLAX PO) Take 8.5 g by mouth daily.     pravastatin (PRAVACHOL) 20 MG tablet Take 20 mg by mouth at bedtime.      Probiotic Product (PROBIOTIC PO) Take 1 tablet by mouth daily.     tamsulosin (FLOMAX) 0.4 MG CAPS capsule Take 1 capsule (0.4 mg total) by mouth daily after supper. 30 capsule 11   TART CHERRY PO Take 200 mg by mouth daily.     Vitamin E 180 MG (400 UNIT) CAPS Take 180 Units by mouth daily.     zinc gluconate 50 MG tablet Take 50 mg by  mouth daily.     No current facility-administered medications for this visit.    Allergies   Allergies as of 09/08/2022 - Review Complete 09/05/2022  Allergen Reaction Noted   Codeine Nausea And Vomiting    Sulfa antibiotics Nausea And Vomiting 03/09/2020     Past Medical History   Past Medical History:  Diagnosis Date   Diabetes (HCC)    diet controlled - no meds   GERD (gastroesophageal reflux disease)    Gout    HTN (hypertension)    Hx of adenomatous colonic polyps    Hypercholesteremia     Past Surgical History   Past Surgical History:  Procedure Laterality Date   ABDOMINAL HYSTERECTOMY     BIOPSY  08/09/2020   Procedure: BIOPSY;  Surgeon: Lemar Lofty., MD;  Location: [facility] ENDOSCOPY;  Service: Gastroenterology;;   CATARACT EXTRACTION W/PHACO Left 06/14/2018   Procedure: CATARACT EXTRACTION PHACO AND INTRAOCULAR LENS PLACEMENT (IOC);  Surgeon: Fabio Pierce, MD;   Location: [facility] ORS;  Service: Ophthalmology;  Laterality: Left;  CDE: 4.13   CATARACT EXTRACTION W/PHACO Right 03/19/2020   Procedure: CATARACT EXTRACTION PHACO AND INTRAOCULAR LENS PLACEMENT (IOC);  Surgeon: Fabio Pierce, MD;  Location: [facility] ORS;  Service: Ophthalmology;  Laterality: Right;  CDE 10.01   COLONOSCOPY  10/25/07   RMR: normal rectum left sided diverticula   COLONOSCOPY N/A 04/26/2014   Dr. Jena Gauss: There are sigmoid diverticula, 3 mm polyp removed from the descending segment, tubular adenoma.  Next colonoscopy in 7 years   ESOPHAGOGASTRODUODENOSCOPY  10/25/07   [id number] esophagogasteic junction focal antral erosions and bular erosions, otherwise normal. mild chronic gastritis.    ESOPHAGOGASTRODUODENOSCOPY (EGD) WITH PROPOFOL N/A 08/09/2020   Procedure: ESOPHAGOGASTRODUODENOSCOPY (EGD) WITH PROPOFOL;  Surgeon: Meridee Score Netty Starring., MD;  Location: [facility] ENDOSCOPY;  Service: Gastroenterology;  Laterality: N/A;   EUS N/A 08/09/2020   Procedure: UPPER ENDOSCOPIC ULTRASOUND (EUS) RADIAL;  Surgeon: Lemar Lofty., MD;  [facility] END[facility];  Service: Gastroenterology;  Laterality: N/A;   PARATHYROIDECTOMY  1999/2000   removed two    Past Family History   Family History  Problem Relation Age of Onset   Colon cancer Paternal Aunt    Heart disease Paternal Aunt    Heart disease Other    Diabetes Mother    Heart disease Sister    Skin cancer Brother    Heart disease Brother    Heart disease Paternal Uncle    Esophageal cancer Neg Hx    Stomach cancer Neg Hx    Inflammatory bowel disease Neg Hx    Liver disease Neg Hx    Pancreatic cancer Neg Hx     Past Social History   Social History   Socioeconomic History   Marital status: Widowed    Spouse name: Not on file   Number of children: Not on file   Years of education: Not on file   Highest education level: Not on file  Occupational History   Occupation: retired Runner, broadcasting/film/video  Tobacco Use   Smoking status: Every Day     Packs/day: 0.25    Years: 40.00    Additional pack years: 0.00    Total pack years: 10.00    Types: Cigarettes   Smokeless tobacco: Never  Vaping Use   Vaping Use: Never used  Substance and Sexual Activity   Alcohol use: No    Alcohol/week: 0.0 standard drinks of alcohol   Drug use: No   Sexual activity: Yes    Birth control/protection: Surgical  Comment: Hysterectomy  Other Topics Concern   Not on file  Social History Narrative   Adopted daughter   Social Determinants of Health   Financial Resource Strain: Not on file  Food Insecurity: Not on file  Transportation Needs: Not on file  Physical Activity: Not on file  Stress: Not on file  Social Connections: Not on file  Intimate Partner Violence: Not on file    Review of Systems   General: Negative for anorexia, weight loss, fever, chills, fatigue, weakness. ENT: Negative for hoarseness, difficulty swallowing , nasal congestion. CV: Negative for chest pain, angina, palpitations, dyspnea on exertion, peripheral edema.  Respiratory: Negative for dyspnea at rest, dyspnea on exertion, cough, sputum, wheezing.  GI: See history of present illness. GU:  Negative for dysuria, hematuria, urinary incontinence, urinary frequency, nocturnal urination.  Endo: Negative for unusual weight change.     Physical Exam   LMP  (LMP Unknown)    General: Well-nourished, well-developed in no acute distress.  Eyes: No icterus. Mouth: Oropharyngeal mucosa moist and pink , no lesions erythema or exudate. Lungs: Clear to auscultation bilaterally.  Heart: Regular rate and rhythm, no murmurs rubs or gallops.  Abdomen: Bowel sounds are normal, nontender, nondistended, no hepatosplenomegaly or masses,  no abdominal bruits or hernia , no rebound or guarding.  Rectal: ***  Extremities: No lower extremity edema. No clubbing or deformities. Neuro: Alert and oriented x 4   Skin: Warm and dry, no jaundice.   Psych: Alert and cooperative, normal  mood and affect.  Labs   *** Imaging Studies   No results found.  Assessment       PLAN   ***MRI pancreas Chest ct 12/2022  Leanna Battles. Melvyn Neth, MHS, PA-C [facility]

## 2022-09-08 ENCOUNTER — Telehealth: Payer: Self-pay | Admitting: *Deleted

## 2022-09-08 ENCOUNTER — Encounter: Payer: Self-pay | Admitting: Gastroenterology

## 2022-09-08 ENCOUNTER — Ambulatory Visit: Payer: Medicare HMO | Admitting: Gastroenterology

## 2022-09-08 ENCOUNTER — Telehealth: Payer: Self-pay | Admitting: Gastroenterology

## 2022-09-08 VITALS — BP 168/91 | HR 85 | Temp 98.0°F | Ht 63.5 in | Wt 152.6 lb

## 2022-09-08 DIAGNOSIS — R935 Abnormal findings on diagnostic imaging of other abdominal regions, including retroperitoneum: Secondary | ICD-10-CM | POA: Diagnosis not present

## 2022-09-08 DIAGNOSIS — K861 Other chronic pancreatitis: Secondary | ICD-10-CM

## 2022-09-08 DIAGNOSIS — R911 Solitary pulmonary nodule: Secondary | ICD-10-CM | POA: Diagnosis not present

## 2022-09-08 NOTE — Telephone Encounter (Signed)
Please NIC for chest CT without contrast 12/2022. Dx pulmonary nodule surveillance

## 2022-09-08 NOTE — Patient Instructions (Signed)
MRI pancreas to be scheduled.  We will remind you when you are due Chest CT to follow up on nodules, around 12/2022.  Continue current stomach medications.

## 2022-09-08 NOTE — Telephone Encounter (Signed)
MRI PA approved [employer]. Auth#  [id number], DOS: Sep 09 2022 - Oct 09 2022   MRI scheduled for 6/27, arrival 8:30am, npo 4 hrs prior. Called pt, no answer. Will call back

## 2022-09-09 NOTE — Telephone Encounter (Signed)
On recall  °

## 2022-09-09 NOTE — Telephone Encounter (Signed)
Pt called back and she is aware of MRI appt details. She voiced understanding and had no questions

## 2022-09-09 NOTE — Telephone Encounter (Signed)
LMOVM

## 2022-10-02 ENCOUNTER — Ambulatory Visit (HOSPITAL_COMMUNITY)
Admission: RE | Admit: 2022-10-02 | Discharge: 2022-10-02 | Disposition: A | Discharge status: Home / Self Care | Payer: Medicare HMO | Source: Ambulatory Visit | Attending: Gastroenterology | Admitting: Gastroenterology

## 2022-10-02 DIAGNOSIS — R935 Abnormal findings on diagnostic imaging of other abdominal regions, including retroperitoneum: Secondary | ICD-10-CM | POA: Diagnosis not present

## 2022-10-02 DIAGNOSIS — K861 Other chronic pancreatitis: Secondary | ICD-10-CM | POA: Insufficient documentation

## 2022-10-02 DIAGNOSIS — I7143 Infrarenal abdominal aortic aneurysm, without rupture: Secondary | ICD-10-CM | POA: Diagnosis not present

## 2022-10-02 DIAGNOSIS — E278 Other specified disorders of adrenal gland: Secondary | ICD-10-CM | POA: Diagnosis not present

## 2022-10-02 DIAGNOSIS — N281 Cyst of kidney, acquired: Secondary | ICD-10-CM | POA: Diagnosis not present

## 2022-10-02 MED ORDER — GADOBUTROL 1 MMOL/ML IV SOLN
7.0000 mL | Freq: Once | INTRAVENOUS | Status: AC | PRN
  Administered 2022-10-02: 7 mL via INTRAVENOUS

## 2022-10-14 ENCOUNTER — Inpatient Hospital Stay: Admission: RE | Admit: 2022-10-14 | Payer: Medicare PPO | Source: Ambulatory Visit

## 2022-10-14 ENCOUNTER — Ambulatory Visit: Payer: Medicare HMO | Admitting: Urology

## 2022-10-14 NOTE — Progress Notes (Deleted)
Name: Michele Pace DOB: 14-Apr-1940 MRN: [id number]  History of Present Illness: Michele Pace is a 82 y.o. female who presents today for follow up visit at [facility] [place]. - GU history: 1. Recurrent UTI. No urine culture results in past 12 months found per chart review. 2. OAB with urinary frequency, nocturia, urgency, and urge incontinence. 3. Intermittent weak urinary stream.  4. Left renal cyst. Per CT 01/02/2020.  At last visit with Dr. Ronne Binning on 09/05/2022: - PVR = 175 ml. - Started Flomax 0.4 mg daily.  Since last visit: ***  Today: She reports ***  She {Actions; denies-reports:120008} increased urinary urgency, frequency, dysuria, gross hematuria, straining to void, or sensations of incomplete emptying.  She {Actions; denies-reports:120008} flank pain. She {Actions; denies-reports:120008} abdominal pain. She {Actions; denies-reports:120008} fevers. She {Actions; denies-reports:120008} nausea/ vomiting.   Fall Screening: Do you usually have a device to assist in your mobility? {yes/no:20286} ***cane / ***walker / ***wheelchair   Medications: Current Outpatient Medications  Medication Sig Dispense Refill   alendronate (FOSAMAX) 70 MG tablet Take 70 mg by mouth once a week.     allopurinol (ZYLOPRIM) 300 MG tablet Take 300 mg by mouth daily.      amLODipine-benazepril (LOTREL) 5-20 MG per capsule Take 1 capsule by mouth daily.      Ascorbic Acid (VITAMIN C) 1000 MG tablet Take 1,000 mg by mouth daily.      aspirin 81 MG tablet Take 81 mg by mouth daily.     b complex vitamins tablet Take 1 tablet by mouth daily.     calcium citrate (CALCITRATE - DOSED IN MG ELEMENTAL CALCIUM) 950 MG tablet Take 600 mg by mouth daily.     Carboxymethylcellulose Sodium (THERATEARS) 0.25 % SOLN Place 1 drop into both eyes daily as needed (Dry eyes).     cholecalciferol (VITAMIN D) 25 MCG (1000 UNIT) tablet Take 1,000 Units by mouth daily.      Coenzyme Q10 (CO Q 10 PO) Take  200 mg by mouth daily.      Cranberry 500 MG TABS Take 500 mg by mouth daily.      fluticasone (FLONASE) 50 MCG/ACT nasal spray Place 2 sprays into both nostrils at bedtime.      lipase/protease/amylase (CREON) 36000 UNITS CPEP capsule Take 2 capsules with each meal. Take 1 capsule with each snack( up 2 snacks per day) 300 capsule 11   loratadine (CLARITIN) 10 MG tablet Take 10 mg by mouth daily.     omega-3 acid ethyl esters (LOVAZA) 1 G capsule Take 1 g by mouth daily.     omeprazole (PRILOSEC) 40 MG capsule Take 40 mg by mouth daily.      polycarbophil (FIBERCON) 625 MG tablet Take 625 mg by mouth daily.     Polyethylene Glycol 3350 (CLEARLAX PO) Take 8.5 g by mouth daily.     pravastatin (PRAVACHOL) 20 MG tablet Take 20 mg by mouth at bedtime.      Probiotic Product (PROBIOTIC PO) Take 1 tablet by mouth daily.     TART CHERRY PO Take 200 mg by mouth daily.     Vitamin E 180 MG (400 UNIT) CAPS Take 180 Units by mouth daily.     zinc gluconate 50 MG tablet Take 50 mg by mouth daily.     No current facility-administered medications for this visit.    Allergies: Allergies  Allergen Reactions   Codeine Nausea And Vomiting    Headache/ NV   Sulfa Antibiotics  Nausea And Vomiting    Mother was allergic    Past Medical History:  Diagnosis Date   Diabetes (HCC)    diet controlled - no meds   GERD (gastroesophageal reflux disease)    Gout    HTN (hypertension)    Hx of adenomatous colonic polyps    Hypercholesteremia    Past Surgical History:  Procedure Laterality Date   ABDOMINAL HYSTERECTOMY     BIOPSY  08/09/2020   Procedure: BIOPSY;  Surgeon: Lemar Lofty., MD;  Location: [facility] ENDOSCOPY;  Service: Gastroenterology;;   CATARACT EXTRACTION W/PHACO Left 06/14/2018   Procedure: CATARACT EXTRACTION PHACO AND INTRAOCULAR LENS PLACEMENT (IOC);  Surgeon: Fabio Pierce, MD;  Location: [facility];  Service: Ophthalmology;  Laterality: Left;  CDE: 4.13   CATARACT EXTRACTION W/PHACO  Right 03/19/2020   Procedure: CATARACT EXTRACTION PHACO AND INTRAOCULAR LENS PLACEMENT (IOC);  Surgeon: Fabio Pierce, MD;  Location: [facility];  Service: Ophthalmology;  Laterality: Right;  CDE 10.01   COLONOSCOPY  10/25/07   RMR: normal rectum left sided diverticula   COLONOSCOPY N/A 04/26/2014   Dr. Jena Gauss: There are sigmoid diverticula, 3 mm polyp removed from the descending segment, tubular adenoma.  Next colonoscopy in 7 years   ESOPHAGOGASTRODUODENOSCOPY  10/25/07   [id number] esophagogasteic junction focal antral erosions and bular erosions, otherwise normal. mild chronic gastritis.    ESOPHAGOGASTRODUODENOSCOPY (EGD) WITH PROPOFOL N/A 08/09/2020   Procedure: ESOPHAGOGASTRODUODENOSCOPY (EGD) WITH PROPOFOL;  Surgeon: Meridee Score Netty Starring., MD;  Location: [facility] ENDOSCOPY;  Service: Gastroenterology;  Laterality: N/A;   EUS N/A 08/09/2020   Procedure: UPPER ENDOSCOPIC ULTRASOUND (EUS) RADIAL;  Surgeon: Lemar Lofty., MD;  Location[facility] ENDOSCOPY;  Service: Gastroenterology;  Laterality: N/A;   PARATHYROIDECTOMY  1999/2000   removed two   Family History  Problem Relation Age of Onset   Colon cancer Paternal Aunt    Heart disease Paternal Aunt    Heart disease Other    Diabetes Mother    Heart disease Sister    Skin cancer Brother    Heart disease Brother    Heart disease Paternal Uncle    Esophageal cancer Neg Hx    Stomach cancer Neg Hx    Inflammatory bowel disease Neg Hx    Liver disease Neg Hx    Pancreatic cancer Neg Hx    Social History   Socioeconomic History   Marital status: Widowed    Spouse name: Not on file   Number of children: Not on file   Years of education: Not on file   Highest education level: Not on file  Occupational History   Occupation: retired Runner, broadcasting/film/video  Tobacco Use   Smoking status: Every Day    Packs/day: 0.25    Years: 40.00    Additional pack years: 0.00    Total pack years: 10.00    Types: Cigarettes   Smokeless tobacco: Never  Vaping  Use   Vaping Use: Never used  Substance and Sexual Activity   Alcohol use: No    Alcohol/week: 0.0 standard drinks of alcohol   Drug use: No   Sexual activity: Yes    Birth control/protection: Surgical    Comment: Hysterectomy  Other Topics Concern   Not on file  Social History Narrative   Adopted daughter   Social Determinants of Health   Financial Resource Strain: Not on file  Food Insecurity: Not on file  Transportation Needs: Not on file  Physical Activity: Not on file  Stress: Not on file  Social Connections:  Not on file  Intimate Partner Violence: Not on file    Review of Systems Constitutional: Patient ***denies any unintentional weight loss or change in strength lntegumentary: Patient ***denies any rashes or pruritus Eyes: Patient denies ***dry eyes ENT: Patient ***denies dry mouth Cardiovascular: Patient ***denies chest pain or syncope Respiratory: Patient ***denies shortness of breath Gastrointestinal: Patient ***denies nausea, vomiting, constipation, or diarrhea Musculoskeletal: Patient ***denies muscle cramps or weakness Neurologic: Patient ***denies convulsions or seizures Psychiatric: Patient ***denies memory problems Allergic/Immunologic: Patient ***denies recent allergic reaction(s) Hematologic/Lymphatic: Patient denies bleeding tendencies Endocrine: Patient ***denies heat/cold intolerance  GU: As per HPI.  OBJECTIVE There were no vitals filed for this visit. There is no height or weight on file to calculate BMI.  Physical Examination Constitutional: ***No obvious distress; patient is ***non-toxic appearing  Cardiovascular: ***No visible lower extremity edema.  Respiratory: The patient does ***not have audible wheezing/stridor; respirations do ***not appear labored  Gastrointestinal: Abdomen ***non-distended Musculoskeletal: ***Normal ROM of UEs  Skin: ***No obvious rashes/open sores  Neurologic: CN 2-12 grossly ***intact Psychiatric: Answered  questions ***appropriately with ***normal affect  Hematologic/Lymphatic/Immunologic: ***No obvious bruises or sites of spontaneous bleeding  UA: {Desc; negative/positive:13464} for *** WBC/hpf, *** RBC/hpf, bacteria (***) *** nitrites, *** leukocytes, *** blood PVR: *** ml  ASSESSMENT No diagnosis found. ***  Will plan for follow up in *** months / ***1 year or sooner if needed. Pt verbalized understanding and agreement. All questions were answered.  PLAN Advised the following: 1. *** 2. ***No follow-ups on file.  No orders of the defined types were placed in this encounter.   It has been explained that the patient is to follow regularly with their PCP in addition to all other providers involved in their care and to follow instructions provided by these respective offices. Patient advised to contact urology clinic if any urologic-pertaining questions, concerns, new symptoms or problems arise in the interim period.  There are no Patient Instructions on file for this visit.  Electronically signed by:  Donnita Falls, FNP   10/14/22    1:17 PM

## 2022-10-20 ENCOUNTER — Telehealth: Payer: Self-pay

## 2022-10-20 DIAGNOSIS — N3281 Overactive bladder: Secondary | ICD-10-CM

## 2022-10-20 NOTE — Telephone Encounter (Signed)
Caller left triage message in media  Tamsulosin causes diarrhea. Patient has stopped medication and requested alternative.  Message sent to MD

## 2022-10-20 NOTE — Telephone Encounter (Signed)
-----   Message from Nurse Jill Side sent at 10/14/2022  3:44 PM EDT ----- Urology triage

## 2022-10-21 MED ORDER — ALFUZOSIN HCL ER 10 MG PO TB24: 10.0000 mg | ORAL_TABLET | Freq: Every day | ORAL | 5 refills | Status: DC

## 2022-10-21 NOTE — Telephone Encounter (Signed)
Received message from Dr. Tacey Ruiz to be sent to pharmacy. Order placed. Patient called and notified. Patient voiced understanding.

## 2022-12-01 ENCOUNTER — Encounter: Payer: Self-pay | Admitting: Internal Medicine

## 2022-12-19 DIAGNOSIS — D692 Other nonthrombocytopenic purpura: Secondary | ICD-10-CM | POA: Diagnosis not present

## 2022-12-19 DIAGNOSIS — E1151 Type 2 diabetes mellitus with diabetic peripheral angiopathy without gangrene: Secondary | ICD-10-CM | POA: Diagnosis not present

## 2022-12-19 DIAGNOSIS — I10 Essential (primary) hypertension: Secondary | ICD-10-CM | POA: Diagnosis not present

## 2022-12-19 DIAGNOSIS — Z299 Encounter for prophylactic measures, unspecified: Secondary | ICD-10-CM | POA: Diagnosis not present

## 2022-12-19 DIAGNOSIS — N390 Urinary tract infection, site not specified: Secondary | ICD-10-CM | POA: Diagnosis not present

## 2022-12-19 DIAGNOSIS — R350 Frequency of micturition: Secondary | ICD-10-CM | POA: Diagnosis not present

## 2023-02-05 DIAGNOSIS — I10 Essential (primary) hypertension: Secondary | ICD-10-CM | POA: Diagnosis not present

## 2023-02-05 DIAGNOSIS — Z299 Encounter for prophylactic measures, unspecified: Secondary | ICD-10-CM | POA: Diagnosis not present

## 2023-02-05 DIAGNOSIS — E1165 Type 2 diabetes mellitus with hyperglycemia: Secondary | ICD-10-CM | POA: Diagnosis not present

## 2023-02-05 DIAGNOSIS — Z0000 Encounter for general adult medical examination without abnormal findings: Secondary | ICD-10-CM | POA: Diagnosis not present

## 2023-02-05 DIAGNOSIS — I739 Peripheral vascular disease, unspecified: Secondary | ICD-10-CM | POA: Diagnosis not present

## 2023-02-05 DIAGNOSIS — I700 Atherosclerosis of aorta: Secondary | ICD-10-CM | POA: Diagnosis not present

## 2023-02-17 IMAGING — MG MM DIGITAL SCREENING BILAT W/ TOMO AND CAD
8 series · 8 of 24 positions shown · non-contrast
Comparison: Previous exam(s).

CLINICAL DATA: Screening.

EXAM:
DIGITAL SCREENING BILATERAL MAMMOGRAM WITH TOMOSYNTHESIS AND CAD
TECHNIQUE: Bilateral screening digital craniocaudal and mediolateral oblique
mammograms were obtained. Bilateral screening digital breast
tomosynthesis was performed. The images were evaluated with
computer-aided detection.

[R CC synth-2D]
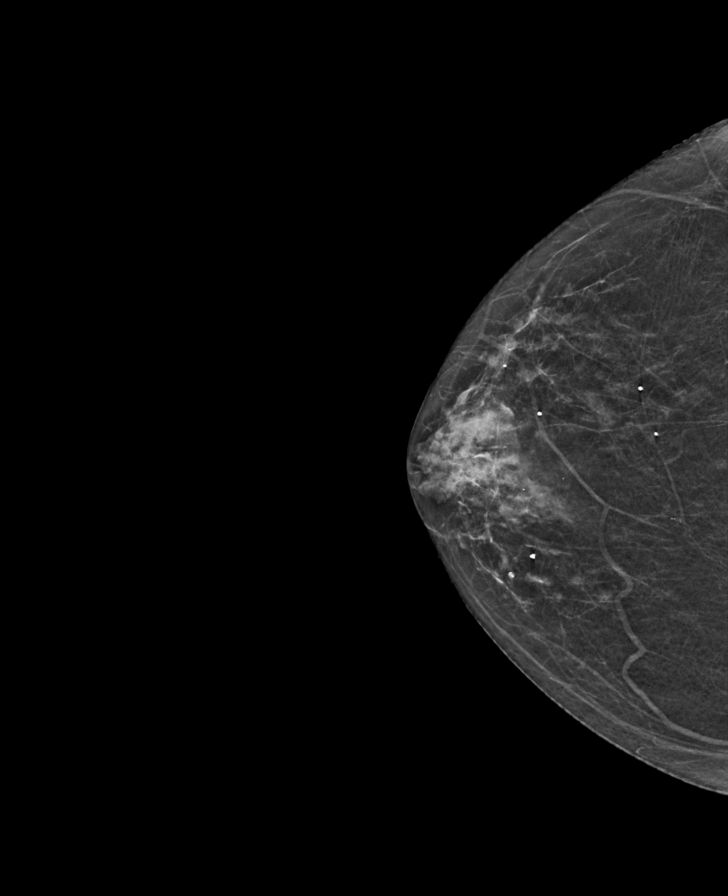

[R MLO synth-2D]
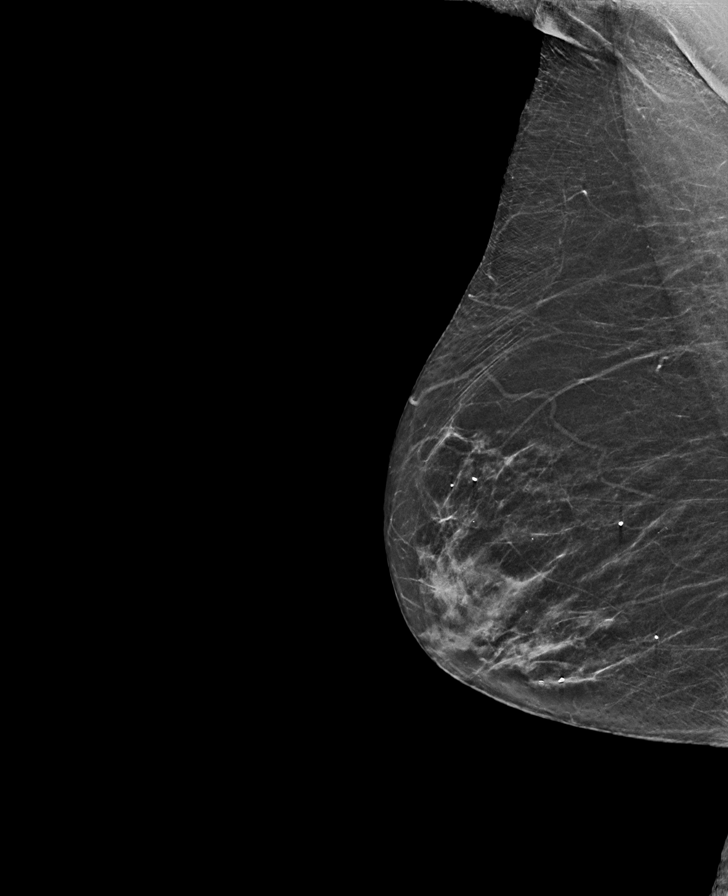

[L CC synth-2D]
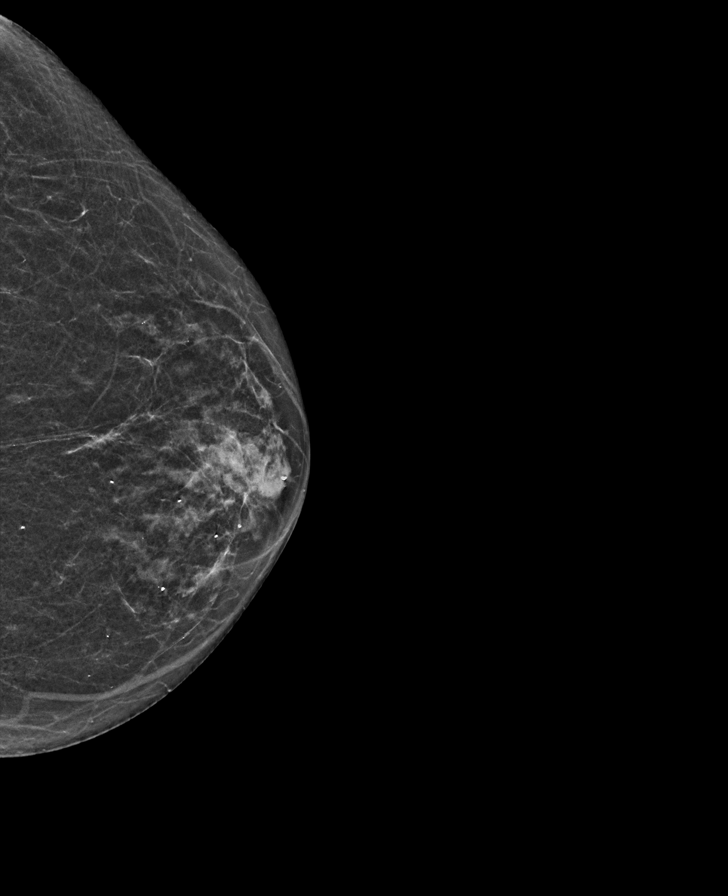

[L MLO synth-2D]
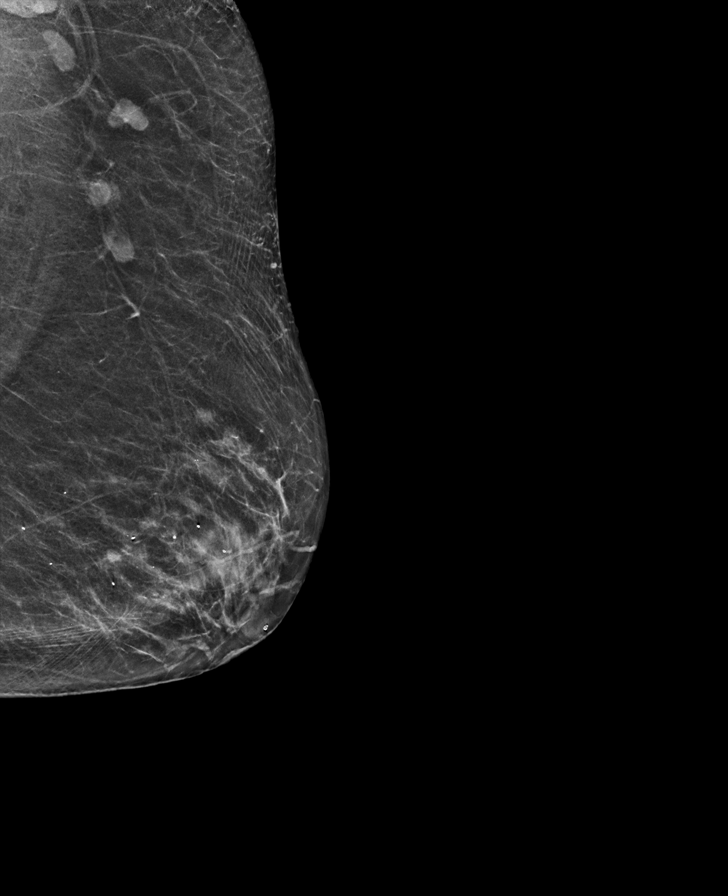

[R CC tomo · tomo slice 24/47.0]
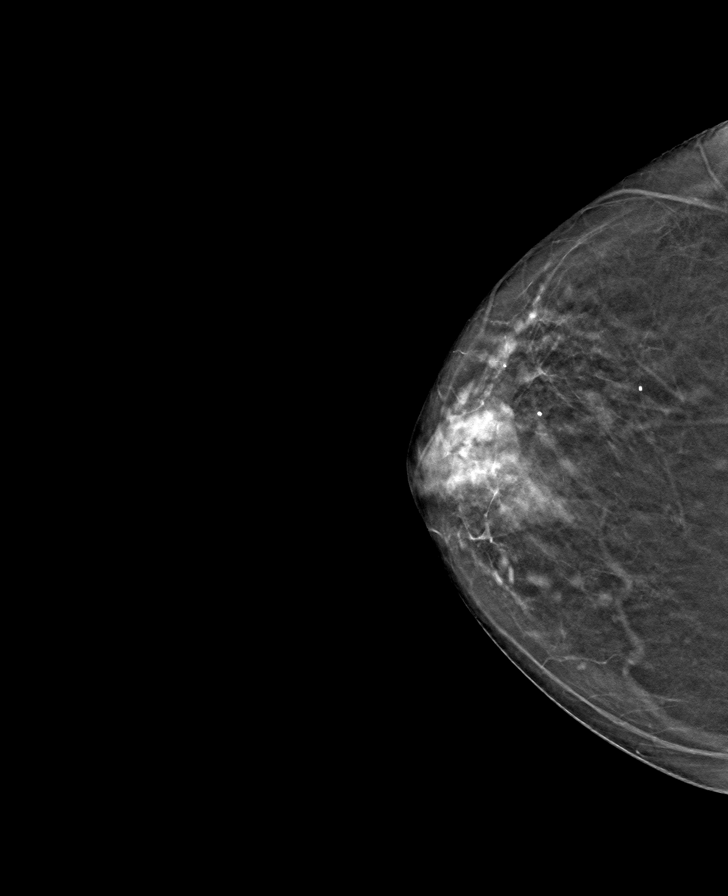

[L MLO tomo · tomo slice 29/58.0]
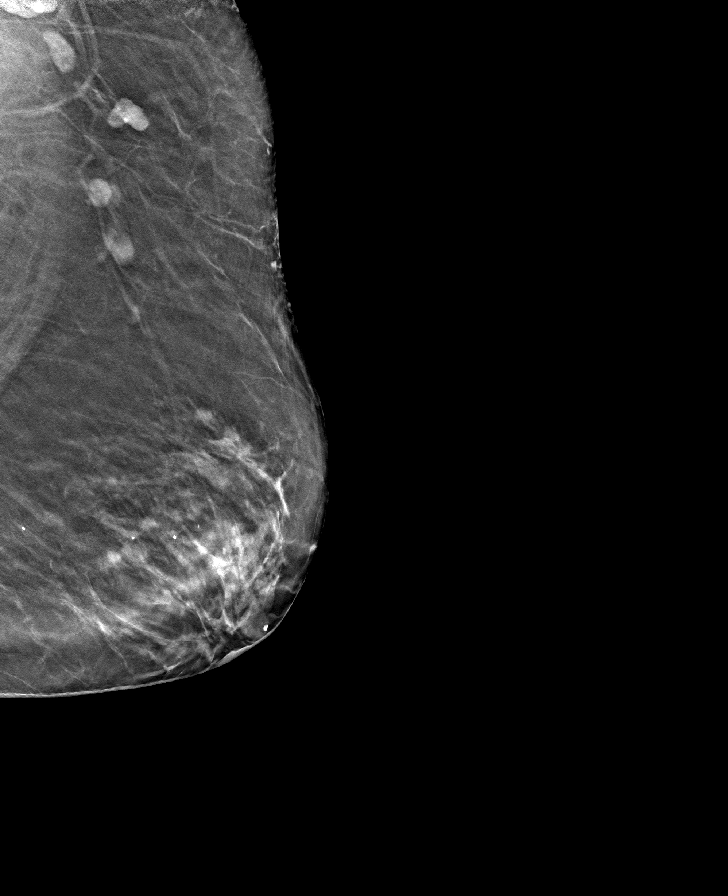

[R MLO tomo · tomo slice 27/53.0]
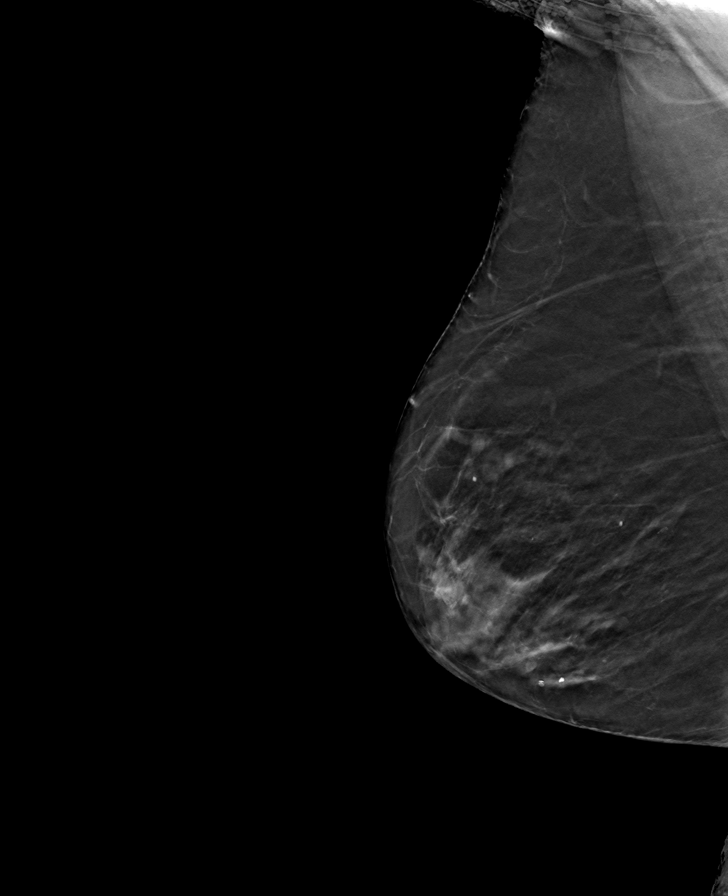

[L CC tomo · tomo slice 23/46.0]
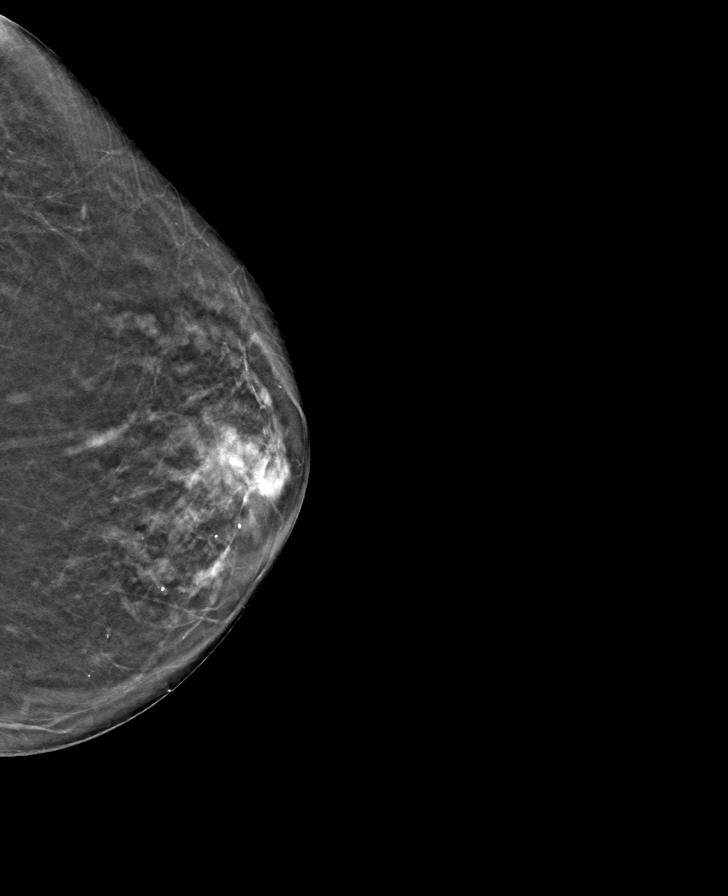

[8 of 24 positions shown; findings below may reference images not displayed]

ACR Breast Density Category b: There are scattered areas of
fibroglandular density.
FINDINGS: There are no findings suspicious for malignancy.
IMPRESSION: No mammographic evidence of malignancy. A result letter of this
screening mammogram will be mailed directly to the patient.

RECOMMENDATION:
Screening mammogram in one year. (Code:51-O-LD2)

BI-RADS CATEGORY  1: Negative.

## 2023-02-19 ENCOUNTER — Other Ambulatory Visit: Payer: Self-pay | Admitting: Internal Medicine

## 2023-02-19 DIAGNOSIS — Z1231 Encounter for screening mammogram for malignant neoplasm of breast: Secondary | ICD-10-CM

## 2023-03-11 ENCOUNTER — Ambulatory Visit
Admission: RE | Admit: 2023-03-11 | Discharge: 2023-03-11 | Disposition: A | Discharge status: Home / Self Care | Payer: Medicare HMO | Source: Ambulatory Visit | Attending: Internal Medicine | Admitting: Internal Medicine

## 2023-03-11 DIAGNOSIS — Z1231 Encounter for screening mammogram for malignant neoplasm of breast: Secondary | ICD-10-CM | POA: Diagnosis not present

## 2023-03-16 ENCOUNTER — Other Ambulatory Visit: Payer: Self-pay | Admitting: Internal Medicine

## 2023-03-16 DIAGNOSIS — R928 Other abnormal and inconclusive findings on diagnostic imaging of breast: Secondary | ICD-10-CM

## 2023-03-16 DIAGNOSIS — E119 Type 2 diabetes mellitus without complications: Secondary | ICD-10-CM | POA: Diagnosis not present

## 2023-03-16 DIAGNOSIS — H524 Presbyopia: Secondary | ICD-10-CM | POA: Diagnosis not present

## 2023-03-16 DIAGNOSIS — H35411 Lattice degeneration of retina, right eye: Secondary | ICD-10-CM | POA: Diagnosis not present

## 2023-03-16 DIAGNOSIS — H52221 Regular astigmatism, right eye: Secondary | ICD-10-CM | POA: Diagnosis not present

## 2023-03-16 DIAGNOSIS — Z961 Presence of intraocular lens: Secondary | ICD-10-CM | POA: Diagnosis not present

## 2023-03-16 DIAGNOSIS — H16223 Keratoconjunctivitis sicca, not specified as Sjogren's, bilateral: Secondary | ICD-10-CM | POA: Diagnosis not present

## 2023-03-23 DIAGNOSIS — M818 Other osteoporosis without current pathological fracture: Secondary | ICD-10-CM | POA: Diagnosis not present

## 2023-03-23 DIAGNOSIS — E2839 Other primary ovarian failure: Secondary | ICD-10-CM | POA: Diagnosis not present

## 2023-04-02 ENCOUNTER — Ambulatory Visit
Admission: RE | Admit: 2023-04-02 | Discharge: 2023-04-02 | Disposition: A | Discharge status: Home / Self Care | Payer: Medicare HMO | Source: Ambulatory Visit | Attending: Internal Medicine | Admitting: Internal Medicine

## 2023-04-02 ENCOUNTER — Other Ambulatory Visit: Payer: Self-pay | Admitting: Internal Medicine

## 2023-04-02 DIAGNOSIS — N6320 Unspecified lump in the left breast, unspecified quadrant: Secondary | ICD-10-CM

## 2023-04-02 DIAGNOSIS — N6323 Unspecified lump in the left breast, lower outer quadrant: Secondary | ICD-10-CM | POA: Diagnosis not present

## 2023-04-02 DIAGNOSIS — R928 Other abnormal and inconclusive findings on diagnostic imaging of breast: Secondary | ICD-10-CM

## 2023-04-03 ENCOUNTER — Ambulatory Visit
Admission: RE | Admit: 2023-04-03 | Discharge: 2023-04-03 | Disposition: A | Discharge status: Home / Self Care | Payer: Medicare HMO | Source: Ambulatory Visit | Attending: Internal Medicine | Admitting: Internal Medicine

## 2023-04-03 DIAGNOSIS — N6323 Unspecified lump in the left breast, lower outer quadrant: Secondary | ICD-10-CM | POA: Diagnosis not present

## 2023-04-03 DIAGNOSIS — C50512 Malignant neoplasm of lower-outer quadrant of left female breast: Secondary | ICD-10-CM | POA: Diagnosis not present

## 2023-04-03 DIAGNOSIS — N6320 Unspecified lump in the left breast, unspecified quadrant: Secondary | ICD-10-CM

## 2023-04-03 HISTORY — PX: BREAST BIOPSY: SHX20

## 2023-04-06 LAB — SURGICAL PATHOLOGY

## 2023-04-20 ENCOUNTER — Other Ambulatory Visit: Payer: Self-pay | Admitting: General Surgery

## 2023-04-20 DIAGNOSIS — Z170 Estrogen receptor positive status [ER+]: Secondary | ICD-10-CM | POA: Diagnosis not present

## 2023-04-20 DIAGNOSIS — C50512 Malignant neoplasm of lower-outer quadrant of left female breast: Secondary | ICD-10-CM | POA: Diagnosis not present

## 2023-04-30 ENCOUNTER — Other Ambulatory Visit: Payer: Self-pay | Admitting: General Surgery

## 2023-04-30 DIAGNOSIS — C50512 Malignant neoplasm of lower-outer quadrant of left female breast: Secondary | ICD-10-CM

## 2023-05-07 ENCOUNTER — Encounter (HOSPITAL_COMMUNITY): Payer: Self-pay

## 2023-05-07 DIAGNOSIS — Z299 Encounter for prophylactic measures, unspecified: Secondary | ICD-10-CM | POA: Diagnosis not present

## 2023-05-07 DIAGNOSIS — I700 Atherosclerosis of aorta: Secondary | ICD-10-CM | POA: Diagnosis not present

## 2023-05-07 DIAGNOSIS — I10 Essential (primary) hypertension: Secondary | ICD-10-CM | POA: Diagnosis not present

## 2023-05-07 DIAGNOSIS — C50912 Malignant neoplasm of unspecified site of left female breast: Secondary | ICD-10-CM | POA: Diagnosis not present

## 2023-05-07 DIAGNOSIS — E1165 Type 2 diabetes mellitus with hyperglycemia: Secondary | ICD-10-CM | POA: Diagnosis not present

## 2023-05-07 DIAGNOSIS — I739 Peripheral vascular disease, unspecified: Secondary | ICD-10-CM | POA: Diagnosis not present

## 2023-05-07 NOTE — Progress Notes (Addendum)
Surgical Instructions    Your procedure is scheduled on Tuesday, 05/12/23.  Report to [facility] Main Entrance "A" at 8:15 A.M., then check in with the Admitting office.  Call this number if you have problems the morning of surgery:  [phone]   If you have any questions prior to your surgery date call [phone]: Open Monday-Friday 8am-4pm If you experience any cold or flu symptoms such as cough, fever, chills, shortness of breath, etc. between now and your scheduled surgery, please notify us at the above number     Remember:  Do not eat after midnight the night before your surgery-Monday.  You may drink clear liquids until 7:15 AM, the morning of your surgery.   Clear liquids allowed are: Water, Non-Citrus Juices (without pulp), Carbonated Beverages, Clear Tea, Black Coffee ONLY (NO MILK, CREAM OR POWDERED CREAMER of any kind), and Gatorade    Take these medicines the morning of surgery with A SIP OF WATER:  alfuzosin (UROXATRAL)  allopurinol (ZYLOPRIM)  THERATEARS Eye Drops loratadine (CLARITIN)  omeprazole (PRILOSEC)    As of today, STOP taking any Aspirin (unless otherwise instructed by your surgeon) Aleve, Naproxen, Ibuprofen, Motrin, Advil, Goody's, BC's, all herbal medications, fish oil, and all vitamins.           Do not wear jewelry or makeup. Do not wear lotions, powders, perfumes or deodorant. Do not shave 48 hours prior to surgery.   Do not bring valuables to the hospital. Do not wear nail polish, gel polish, artificial nails, or any other type of covering on natural nails (fingers and toes) If you have artificial nails or gel coating that need to be removed by a nail salon, please have this removed prior to surgery. Artificial nails or gel coating may interfere with anesthesia's ability to adequately monitor your vital signs.  [facility] is not responsible for any belongings or valuables.    Contacts, glasses, hearing aids, dentures or partials may not be worn  into surgery, please bring cases for these belongings   Patients discharged the day of surgery will not be allowed to drive home, and someone needs to stay with them for 24 hours.   SURGICAL WAITING ROOM VISITATION Patients having surgery or a procedure may have no more than 2 support people in the waiting area - these visitors may rotate.   Children under the age of 40 must have an adult with them who is not the patient. If the patient needs to stay at the hospital during part of their recovery, the visitor guidelines for inpatient rooms apply. Pre-op nurse will coordinate an appropriate time for 1 support person to accompany patient in pre-op.  This support person may not rotate.   Please refer to [email or web address] for the visitor guidelines for Inpatients (after your surgery is over and you are in a regular room).    Special instructions:    Oral Hygiene is also important to reduce your risk of infection.  Remember - BRUSH YOUR TEETH THE MORNING OF SURGERY WITH YOUR REGULAR TOOTHPASTE   [facility]- Preparing For Surgery  Before surgery, you can play an important role. Because skin is not sterile, your skin needs to be as free of germs as possible. You can reduce the number of germs on your skin by washing with CHG (chlorahexidine gluconate) Soap before surgery.  CHG is an antiseptic cleaner which kills germs and bonds with the skin to continue killing germs even after washing.     Please do not  use if you have an allergy to CHG or antibacterial soaps. If your skin becomes reddened/irritated stop using the CHG.  Do not shave (including legs and underarms) for at least 48 hours prior to first CHG shower. It is OK to shave your face.  Please follow these instructions carefully.     Shower the [employer] [facility]-Mon and the MORNING OF SURGERY-Tues with CHG Soap.   If you chose to wash your hair, wash your hair first as usual with  your normal shampoo. After you shampoo, rinse your hair and body thoroughly to remove the shampoo.  Then [employer] and genitals (private parts) with your normal soap and rinse thoroughly to remove soap.  After that Use CHG Soap as you would any other liquid soap. You can apply CHG directly to the skin and wash gently with a scrungie or a clean washcloth.   Apply the CHG Soap to your body ONLY FROM THE NECK DOWN.  Do not use on open wounds or open sores. Avoid contact with your eyes, ears, mouth and genitals (private parts). Wash Face and genitals (private parts)  with your normal soap.   Wash thoroughly, paying special attention to the area where your surgery will be performed.  Thoroughly rinse your body with warm water from the neck down.  DO NOT shower/wash with your normal soap after using and rinsing off the CHG Soap.  Pat yourself dry with a CLEAN TOWEL.  Wear CLEAN PAJAMAS to bed the night before surgery  Place CLEAN SHEETS on your bed the night before your surgery  DO NOT SLEEP WITH PETS.   Day of Surgery:  Take a shower with CHG soap. Wear Clean/Comfortable clothing the morning of surgery Do not apply any deodorants/lotions.   Remember to brush your teeth WITH YOUR REGULAR TOOTHPASTE.    If you received a COVID test during your pre-op visit, it is requested that you wear a mask when out in public, stay away from anyone that may not be feeling well, and notify your surgeon if you develop symptoms. If you have been in contact with anyone that has tested positive in the last 10 days, please notify your surgeon.    Please read over the following fact sheets that you were given.

## 2023-05-07 NOTE — Progress Notes (Signed)
PCP - Dr Kirstie Peri Cardiologist -  none  Chest x-ray - n/a EKG - 05/08/23 Stress Test - n/a ECHO - n/a Cardiac Cath - n/a  ICD Pacemaker/Loop - n/a  Sleep Study -  n/a  Diabetes Type 2 Fasting Blood Sugar - 150s-160s Checks Blood Sugar twice weekly  Do not take Meformin on the morning or surgery.  If your blood sugar is less than 70 mg/dL, you will need to treat for low blood sugar: Treat a low blood sugar (less than 70 mg/dL) with  cup of clear juice (cranberry or apple), 4 glucose tablets, OR glucose gel. Recheck blood sugar in 15 minutes after treatment (to make sure it is greater than 70 mg/dL). If your blood sugar is not greater than 70 mg/dL on recheck, call [phone] for further instructions.  Blood Thinner Instructions:  n/a  Aspirin Instructions: Follow your surgeon's instructions on when to stop aspirin prior to surgery,  If no instructions were given by your surgeon then you will need to call the office for those instructions.  ERAS - Clear liquids til 8:30 AM DOS.  Anesthesia review: Yes  STOP now taking any Aspirin (unless otherwise instructed by your surgeon), Aleve, Naproxen, Ibuprofen, Motrin, Advil, Goody's, BC's, all herbal medications, fish oil, and all vitamins.   Coronavirus Screening Do you have any of the following symptoms:  Cough yes/no: No Fever (>100.38F)  yes/no: No Runny nose yes/no: No Sore throat yes/no: No Difficulty breathing/shortness of breath  yes/no: No  Have you traveled in the last 14 days and where? yes/no: No  Patient verbalized understanding of instructions that were given to them at the PAT appointment. Patient was also instructed that they will need to review over the PAT instructions again at home before surgery.

## 2023-05-08 ENCOUNTER — Encounter (HOSPITAL_COMMUNITY): Payer: Self-pay

## 2023-05-08 ENCOUNTER — Encounter (HOSPITAL_COMMUNITY)
Admission: RE | Admit: 2023-05-08 | Discharge: 2023-05-08 | Disposition: A | Discharge status: Home / Self Care | Payer: Medicare HMO | Source: Ambulatory Visit | Attending: General Surgery

## 2023-05-08 ENCOUNTER — Other Ambulatory Visit: Payer: Self-pay

## 2023-05-08 VITALS — BP 157/72 | HR 84 | Temp 98.7°F | Resp 16 | Ht 62.0 in | Wt 152.1 lb

## 2023-05-08 DIAGNOSIS — Z7984 Long term (current) use of oral hypoglycemic drugs: Secondary | ICD-10-CM | POA: Insufficient documentation

## 2023-05-08 DIAGNOSIS — K219 Gastro-esophageal reflux disease without esophagitis: Secondary | ICD-10-CM | POA: Insufficient documentation

## 2023-05-08 DIAGNOSIS — I10 Essential (primary) hypertension: Secondary | ICD-10-CM | POA: Insufficient documentation

## 2023-05-08 DIAGNOSIS — Z79899 Other long term (current) drug therapy: Secondary | ICD-10-CM | POA: Diagnosis not present

## 2023-05-08 DIAGNOSIS — I7143 Infrarenal abdominal aortic aneurysm, without rupture: Secondary | ICD-10-CM | POA: Diagnosis not present

## 2023-05-08 DIAGNOSIS — E119 Type 2 diabetes mellitus without complications: Secondary | ICD-10-CM | POA: Insufficient documentation

## 2023-05-08 DIAGNOSIS — Z1721 Progesterone receptor positive status: Secondary | ICD-10-CM | POA: Diagnosis not present

## 2023-05-08 DIAGNOSIS — C50912 Malignant neoplasm of unspecified site of left female breast: Secondary | ICD-10-CM | POA: Insufficient documentation

## 2023-05-08 DIAGNOSIS — I440 Atrioventricular block, first degree: Secondary | ICD-10-CM | POA: Insufficient documentation

## 2023-05-08 DIAGNOSIS — E7800 Pure hypercholesterolemia, unspecified: Secondary | ICD-10-CM | POA: Diagnosis not present

## 2023-05-08 DIAGNOSIS — I491 Atrial premature depolarization: Secondary | ICD-10-CM | POA: Insufficient documentation

## 2023-05-08 DIAGNOSIS — Z170 Estrogen receptor positive status [ER+]: Secondary | ICD-10-CM | POA: Diagnosis not present

## 2023-05-08 DIAGNOSIS — Z87891 Personal history of nicotine dependence: Secondary | ICD-10-CM | POA: Diagnosis not present

## 2023-05-08 DIAGNOSIS — Z01818 Encounter for other preprocedural examination: Secondary | ICD-10-CM | POA: Insufficient documentation

## 2023-05-08 DIAGNOSIS — C50512 Malignant neoplasm of lower-outer quadrant of left female breast: Secondary | ICD-10-CM | POA: Diagnosis not present

## 2023-05-08 HISTORY — DX: Malignant (primary) neoplasm, unspecified: C80.1

## 2023-05-08 HISTORY — DX: Other seasonal allergic rhinitis: J30.2

## 2023-05-08 HISTORY — DX: Headache, unspecified: R51.9

## 2023-05-08 LAB — BASIC METABOLIC PANEL
Anion gap: 11 (ref 5–15)
BUN: 16 mg/dL (ref 8–23)
CO2: 25 mmol/L (ref 22–32)
Calcium: 10.2 mg/dL (ref 8.9–10.3)
Chloride: 102 mmol/L (ref 98–111)
Creatinine, Ser: 0.75 mg/dL (ref 0.44–1.00)
GFR, Estimated: >60 mL/min (ref >60)
Glucose, Bld: 140 mg/dL — ABNORMAL HIGH (ref 70–99)
Potassium: 3.9 mmol/L (ref 3.5–5.1)
Sodium: 138 mmol/L (ref 135–145)

## 2023-05-08 LAB — GLUCOSE, CAPILLARY: Glucose-Capillary: 198 mg/dL — ABNORMAL HIGH (ref 70–99)

## 2023-05-08 LAB — CBC
HCT: 40.6 % (ref 36.0–46.0)
Hemoglobin: 13.6 g/dL (ref 12.0–15.0)
MCH: 31.9 pg (ref 26.0–34.0)
MCHC: 33.5 g/dL (ref 30.0–36.0)
MCV: 95.3 fL (ref 80.0–100.0)
Platelets: 251 10*3/uL (ref 150–400)
RBC: 4.26 MIL/uL (ref 3.87–5.11)
RDW: 13 % (ref 11.5–15.5)
WBC: 10.1 10*3/uL (ref 4.0–10.5)
nRBC: 0 % (ref 0.0–0.2)

## 2023-05-11 ENCOUNTER — Ambulatory Visit
Admission: RE | Admit: 2023-05-11 | Discharge: 2023-05-11 | Disposition: A | Discharge status: Home / Self Care | Payer: Medicare HMO | Source: Ambulatory Visit | Attending: General Surgery | Admitting: General Surgery

## 2023-05-11 DIAGNOSIS — D0512 Intraductal carcinoma in situ of left breast: Secondary | ICD-10-CM | POA: Diagnosis not present

## 2023-05-11 DIAGNOSIS — Z170 Estrogen receptor positive status [ER+]: Secondary | ICD-10-CM

## 2023-05-11 HISTORY — PX: BREAST BIOPSY: SHX20

## 2023-05-11 LAB — HEMOGLOBIN A1C
Hgb A1c MFr Bld: 7.6 % — ABNORMAL HIGH (ref 4.8–5.6)
Mean Plasma Glucose: 171.42 mg/dL

## 2023-05-11 NOTE — Progress Notes (Signed)
Anesthesia Chart Review:  Case: [id number] Date/Time: 05/12/23 1000   Procedure: LEFT BREAST LUMPECTOMY WITH RADIOACTIVE SEED LOCALIZATION (Left)   Anesthesia type: General   Pre-op diagnosis: LEFT BREAST CANCER   [facility] / [facility]   Surgeons: Almond Lint, MD       DISCUSSION: Patient is an 83 year old female scheduled for the above procedure.  History includes former smoker, HTN, hypercholesterolemia, GERD, DM2, left breast cancer, hysterectomy, parathyroidectomy (~ 2000). 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 She is followed by GI.  RSL planned for 05/11/23 at 2 PM. Anesthesia team to evaluate on the day of surgery.    VS: BP (!) 157/72   Pulse 84   Temp 37.1 C   Resp 16   Ht 5\' 2"  (1.575 m)   Wt 69 kg   LMP  (LMP Unknown)   SpO2 97%   BMI 27.82 kg/m   PROVIDERS: Kirstie Peri, MD is PCP  Roetta Sessions, MD is GI  LABS: Labs reviewed: Acceptable for surgery. (all labs ordered are listed, but only abnormal results are displayed)  Labs Reviewed  GLUCOSE, CAPILLARY - Abnormal; Notable for the following components:      Result Value   Glucose-Capillary 198 (*)    All other components within normal limits  BASIC METABOLIC PANEL - Abnormal; Notable for the following components:   Glucose, Bld 140 (*)    All other components within normal limits  CBC     EKG:  EKG 05/08/23: Sinus tachycardia with 1st degree A-V block with Premature atrial complexes Left axis deviation Inferior infarct , age undetermined Possible Anterior infarct , age undetermined Abnormal ECG When compared with ECG of 15-Mar-2020 11:09, Confirmed by Bryan Lemma ([id number]) on 05/09/2023 3:59:24 PM  EKG 03/15/20: Normal sinus rhythm Left axis deviation Inferior infarct , age undetermined Anterior infarct , age undetermined Abnormal  ECG Confirmed by Carylon Perches [phone]) on 03/15/2020 7:43:40 PM   CV: N/A  Past Medical History:  Diagnosis Date   Cancer (HCC)    Left Breast Cancer - lower-outer quadrant of left breast   Diabetes (HCC)    type 2   GERD (gastroesophageal reflux disease)    Gout    no current problem   Headache    no current problem   HTN (hypertension)    Hx of adenomatous colonic polyps    Hypercholesteremia    Seasonal allergies     Past Surgical History:  Procedure Laterality Date   ABDOMINAL HYSTERECTOMY     BIOPSY  08/09/2020   Procedure: BIOPSY;  Surgeon: Lemar Lofty., MD;  Location: [facility] ENDOSCOPY;  Service: Gastroenterology;;   BREAST BIOPSY Left 04/03/2023   [place] LT BREAST BX W LOC DEV 1ST LESION IMG BX SPEC US GUIDE 04/03/2023 [facility] MAMMOGRAPHY   CATARACT EXTRACTION W/PHACO Left 06/14/2018   Procedure: CATARACT EXTRACTION PHACO AND INTRAOCULAR LENS PLACEMENT (IOC);  Surgeon: Fabio Pierce, MD;  Location: [facility] ORS;  Service: Ophthalmology;  Laterality: Left;  CDE: 4.13   CATARACT EXTRACTION W/PHACO Right 03/19/2020   Procedure: CATARACT EXTRACTION PHACO AND INTRAOCULAR LENS PLACEMENT (IOC);  Surgeon: Fabio Pierce, MD;  Location: [facility] ORS;  Service: Ophthalmology;  Laterality: Right;  CDE 10.01   COLONOSCOPY  10/25/07   RMR: normal rectum left sided diverticula   COLONOSCOPY N/A 04/26/2014   Dr. Jena Gauss: There are sigmoid diverticula, 3  mm polyp removed from the descending segment, tubular adenoma.  Next colonoscopy in 7 years   ESOPHAGOGASTRODUODENOSCOPY  10/25/07   [id number] esophagogasteic junction focal antral erosions and bular erosions, otherwise normal. mild chronic gastritis.    ESOPHAGOGASTRODUODENOSCOPY (EGD) WITH PROPOFOL N/A 08/09/2020   Procedure: ESOPHAGOGASTRODUODENOSCOPY (EGD) WITH PROPOFOL;  Surgeon: Meridee Score Netty Starring., MD;  Location: [facility] ENDOSCOPY;  Service: Gastroenterology;  Laterality: N/A;   EUS N/A 08/09/2020   Procedure: UPPER ENDOSCOPIC ULTRASOUND (EUS)  RADIAL;  Surgeon: Lemar Lofty., MD;  Location[facility] ENDOSCOPY;  Service: Gastroenterology;  Laterality: N/A;   PARATHYROIDECTOMY  1999/2000   removed two    MEDICATIONS:  metFORMIN (GLUCOPHAGE) 500 MG tablet   alfuzosin (UROXATRAL) 10 MG 24 hr tablet   allopurinol (ZYLOPRIM) 300 MG tablet   amLODipine-benazepril (LOTREL) 5-20 MG per capsule   Ascorbic Acid (VITAMIN C) 1000 MG tablet   aspirin 81 MG tablet   b complex vitamins tablet   calcium citrate (CALCITRATE - DOSED IN MG ELEMENTAL CALCIUM) 950 MG tablet   Carboxymethylcellulose Sodium (THERATEARS) 0.25 % SOLN   Cholecalciferol (VITAMIN D3) 125 MCG (5000 UT) TABS   Coenzyme Q10 (CO Q 10 PO)   Cranberry 500 MG TABS   Cyanocobalamin (B-12) 2500 MCG TABS   docusate sodium (COLACE) 100 MG capsule   fluticasone (FLONASE) 50 MCG/ACT nasal spray   lipase/protease/amylase (CREON) 36000 UNITS CPEP capsule   loratadine (CLARITIN) 10 MG tablet   omega-3 acid ethyl esters (LOVAZA) 1 G capsule   Omega-3 Fatty Acids (FISH OIL) 1200 MG CAPS   omeprazole (PRILOSEC) 40 MG capsule   polycarbophil (FIBERCON) 625 MG tablet   Polyethylene Glycol 3350 (CLEARLAX PO)   pravastatin (PRAVACHOL) 20 MG tablet   Probiotic Product (PROBIOTIC PO)   pyridOXINE (VITAMIN B6) 100 MG tablet   Vitamin E 180 MG (400 UNIT) CAPS   zinc gluconate 50 MG tablet   No current facility-administered medications for this encounter.    Shonna Chock, PA-C Surgical Short Stay/Anesthesiology [facility] Phone [phone] [facility] Phone [phone] 05/11/2023 1:31 PM

## 2023-05-11 NOTE — H&P (Signed)
REFERRING PHYSICIAN:  Hu   PROVIDER:  Matthias Hughs, MD   Care Team: Patient Care Team: Hortencia Pilar, MD as PCP - General (Internal Medicine) Matthias Hughs, MD as Consulting Provider (Surgical Oncology)    MRN: [id number] DOB: Nov 28, 1940 DATE OF ENCOUNTER: 04/20/2023   Subjective    Chief Complaint: New Consultation (BREAST)   History of Present Illness: Michele Pace is a 83 y.o. female who is seen today as an office consultation at the request of Dr. Sherryll Burger for evaluation of New Consultation (BREAST) .     Patient presents with a new diagnosis of left breast cancer in January 2024.  The patient had a screening recall for a left breast mass.  The diagnostic imaging was performed and there was and 11 millimeter mass on mammography, but a 7 mm mass on ultrasound in the 4:00 location.  The axilla was negative for concerning lymphadenopathy a core needle biopsy was performed.  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  This was ductal phenotype and was strongly ER and PR positive, HER2 negative, and Ki-67 of 5%.   Patient is accompanied by her friends.   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  She now does a lot of reading.   She had a paternal aunt with colon cancer.     Diagnostic mammogram/us :[facility] 04/02/2023 ACR Breast Density Category b: There are scattered areas of fibroglandular density. FINDINGS: MAMMOGRAM: Spot tomosynthesis views demonstrate a 11 mm spiculated mass in the lower outer left breast posterior depth (spot CC image 32/42, spot MLO image 35/50). This corresponds with the screening mammogram finding and is new compared to prior examinations. No additional suspicious findings are identified in the left breast.  ULTRASOUND:  Targeted left breast ultrasound was performed. At 4 o'clock 3 cm from the nipple, there is an irregular hypoechoic mass that  measures 7 x 4 x 5 mm. There is moderate surrounding vascularity. This corresponds with the spiculated mass seen mammogram.  Targeted left axillary ultrasound demonstrates multiple morphologically benign lymph nodes. No lymphadenopathy.  IMPRESSION: 1. Left breast 7 mm spiculated mass in the 4 o'clock position is suspicious for malignancy. Recommend further assessment ultrasound-guided biopsy. 2. No left axillary lymphadenopathy.  RECOMMENDATION: Left breast ultrasound-guided biopsy (1 site).  I have discussed the findings and recommendations with the patient. The biopsy procedure was discussed with the patient and questions were answered. Patient expressed their understanding of the biopsy recommendation. Patient will be scheduled for biopsy at her earliest convenience by the schedulers. Ordering provider will be notified. If applicable, a reminder letter will be sent to the patient regarding the next appointment.  BI-RADS CATEGORY  5: Highly suggestive of malignancy.     Pathology core needle biopsy: 04/03/2023 1. Breast, left, needle core biopsy, 4:00, 3 cmfn :       -  INVASIVE MAMMARY CARCINOMA       -  MAMMARY CARCINOMA IN-SITU    Receptors: Estrogen Receptor:  100%, POSITIVE, STRONG STAINING INTENSITY  Progesterone Receptor:  90%, POSITIVE, STRONG STAINING INTENSITY  Proliferation Marker Ki67:  5%  GROUP 5:   HER2 **NEGATIVE**      Review of Systems: A complete review of systems was obtained from the patient.  I have reviewed this information and discussed as appropriate with the patient.  See HPI as well for other ROS. ROS positive for nasal congestion and anxiety.  Medical History: Past Medical History      Past Medical History:  Diagnosis Date   Diabetes mellitus without complication (CMS/HHS-HCC)          Problem List     Patient Active Problem List  Diagnosis   Malignant neoplasm of lower-outer quadrant of left breast of female, estrogen receptor  positive (CMS/HHS-HCC)        Past Surgical History       Past Surgical History:  Procedure Laterality Date   PARATHYROIDECTOMY            Allergies      Allergies  Allergen Reactions   Codeine Nausea and Headache        Medications Ordered Prior to Encounter        Current Outpatient Medications on File Prior to Visit  Medication Sig Dispense Refill   alfuzosin (UROXATRAL) 10 mg ER tablet Take 10 mg by mouth daily with breakfast       allopurinoL (ZYLOPRIM) 300 MG tablet Take 300 mg by mouth once daily       amLODIPine-benazepril (LOTREL) 5-20 mg capsule Take 1 capsule by mouth once daily       ascorbic acid, vitamin C, (VITAMIN C) 1000 MG tablet Take 1,000 mg by mouth once daily       aspirin 81 MG EC tablet Take 81 mg by mouth once daily       b complex vitamins tablet Take 1 tablet by mouth once daily       calcium citrate (CALCITRATE) 200 mg (950 mg) tablet Take 600 mg by mouth once daily       calcium polycarbophiL (FIBERCON) 625 mg tablet Take 625 mg by mouth once daily       cholecalciferol (VITAMIN D3) 1000 unit tablet Take 1,000 Units by mouth once daily       coenzyme Q10, bulk, Powd Take 200 mg by mouth once daily       cyanocobalamin (VITAMIN B12) 1,000 mcg/mL injection Inject into the muscle monthly       loratadine (CLARITIN) 10 mg tablet Take 10 mg by mouth once daily       omega-3 acid ethyl esters (LOVAZA) 1 gram capsule Take 1 g by mouth once daily       omeprazole (PRILOSEC) 40 MG DR capsule Take 40 mg by mouth once daily       pravastatin (PRAVACHOL) 20 MG tablet Take 20 mg by mouth at bedtime       pyridoxine, vitamin B6, (VITAMIN B-6) 25 MG tablet Take 25 mg by mouth once daily       vitamin E 400 UNIT capsule Take 400 Units by mouth once daily       zinc gluconate 50 mg tablet Take 50 mg by mouth once daily        No current facility-administered medications on file prior to visit.        Family History       Family History  Problem Relation  Age of Onset   High blood pressure (Hypertension) Brother     Hyperlipidemia (Elevated cholesterol) Brother     Coronary Artery Disease (Blocked arteries around heart) Brother     Colon cancer Maternal Aunt          Tobacco Use History  Social History        Tobacco Use  Smoking Status Former   Types: Cigarettes  Smokeless Tobacco Not on file  Social History  Social History         Socioeconomic History   Marital status: Unknown  Tobacco Use   Smoking status: Former      Types: Cigarettes  Vaping Use   Vaping status: Unknown  Substance and Sexual Activity   Alcohol use: Not Currently   Drug use: Never    Social Drivers of Health        Housing Stability: Unknown (04/20/2023)    Housing Stability Vital Sign     Homeless in the Last Year: No        Objective:         Vitals:    04/20/23 1350  BP: (!) 141/84  Pulse: 101  Temp: 36.6 C (97.9 F)  SpO2: 96%  Weight: 69.5 kg (153 lb 3.2 oz)  Height: 161.3 cm (5' 3.5")  PainSc: 0-No pain    Body mass index is 26.71 kg/m.   Gen:  No acute distress.  Well nourished and well groomed.   Neurological: Alert and oriented to person, place, and time. Coordination normal.  Head: Normocephalic and atraumatic.  Eyes: Conjunctivae are normal. Pupils are equal, round, and reactive to light. No scleral icterus.  Neck: Normal range of motion. Neck supple. No tracheal deviation or thyromegaly present.  Cardiovascular: Normal rate, regular rhythm, normal heart sounds and intact distal pulses.  Exam reveals no gallop and no friction rub.  No murmur heard. Breast: ptotic bilaterally. Relatively symmetric. Scattered density in breast.  No palpable masses. No skin dimpling. No nipple retraction or nipple discharge.  No LAD. No contour changes.  Respiratory: Effort normal.  No respiratory distress. No chest wall tenderness. Breath sounds normal.  No wheezes, rales or rhonchi.  GI: Soft. Bowel sounds are normal. The abdomen  is soft and nontender.  There is no rebound and no guarding.  Musculoskeletal: Normal range of motion. Extremities are nontender.  Lymphadenopathy: No cervical, preauricular, postauricular or axillary adenopathy is present Skin: Skin is warm and dry. No rash noted. No diaphoresis. No erythema. No pallor. No clubbing, cyanosis, or edema.   Psychiatric: Normal mood and affect. Behavior is normal. Judgment and thought content normal.      Labs None avail.    Assessment and Plan:        ICD-10-CM    1. Malignant neoplasm of lower-outer quadrant of left breast of female, estrogen receptor positive (CMS/HHS-HCC)  C50.512      Z17.0         Patient has a new diagnosis of clinical T1b-cN0 hormone positive left breast cancer.  Due to her age and favorable prognostic profile, I would not plan to do a sentinel lymph node biopsy.  I reviewed surgery with the patient.  I discussed seed placement and the rationale for this.  I reviewed the incision and the process by which we do the lumpectomy.  I discussed risks including bleeding, infection, possible need for additional surgery, possible seroma, possible lymphedema, possible change in breast appearance, possible heart or lung complications, possible stroke or blood clot.  Patient was informed that people are typically under general anesthesia for the procedure.   I discussed postoperative expectations including lifting restrictions and postop pain.  Advised that the patient would need someone to drive them and someone to be with them overnight the day of surgery.   I am referring the patient to medical and radiation oncology.  The patient lives in [place] and desires to go to the cancer center there.  Patient is given a prescription for [employer] to nature for compression bra.  Orders and posting sheet are submitted.

## 2023-05-11 NOTE — Anesthesia Preprocedure Evaluation (Addendum)
Anesthesia Evaluation  Patient identified by MRN, date of birth, ID band Patient awake    Reviewed: Allergy & Precautions, NPO status , Patient's Chart, lab work & pertinent test results  History of Anesthesia Complications Negative for: history of anesthetic complications  Airway Mallampati: III  TM Distance: <3 FB Neck ROM: Full  Mouth opening: Limited Mouth Opening  Dental  (+) Teeth Intact, Dental Advisory Given   Pulmonary neg shortness of breath, neg sleep apnea, neg COPD, neg recent URI, Patient abstained from smoking., former smoker   breath sounds clear to auscultation       Cardiovascular hypertension, Pt. on medications (-) angina (-) Past MI and (-) CHF  Rhythm:Regular     Neuro/Psych  Headaches  negative psych ROS   GI/Hepatic Neg liver ROS,GERD  ,,  Endo/Other  diabetes, Type 2, Oral Hypoglycemic Agents    Renal/GU negative Renal ROS  negative genitourinary   Musculoskeletal negative musculoskeletal ROS (+)    Abdominal   Peds  Hematology negative hematology ROS (+)   Anesthesia Other Findings History includes former smoker, HTN, hypercholesterolemia, GERD, DM2, left breast cancer, hysterectomy, parathyroidectomy (~ 2000). 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, right adrenal nodules (stable since 2019, most c/w lip poor adenoma)  Reproductive/Obstetrics                             Anesthesia Physical Anesthesia Plan  ASA: 3  Anesthesia Plan: General   Post-op Pain Management: Tylenol PO (pre-op)*   Induction: Intravenous  PONV Risk Score and Plan: 3 and Ondansetron, Dexamethasone and Treatment may vary due to age or medical condition  Airway Management Planned: LMA  Additional Equipment: None  Intra-op Plan:   Post-operative Plan: Extubation in OR  Informed Consent: I have reviewed the  patients History and Physical, chart, labs and discussed the procedure including the risks, benefits and alternatives for the proposed anesthesia with the patient or authorized representative who has indicated his/her understanding and acceptance.     Dental advisory given  Plan Discussed with: CRNA  Anesthesia Plan Comments: (PAT note written 05/11/2023 by Shonna Chock, PA-C.  )       Anesthesia Quick Evaluation

## 2023-05-12 ENCOUNTER — Ambulatory Visit
Admission: RE | Admit: 2023-05-12 | Discharge: 2023-05-12 | Disposition: A | Discharge status: Home / Self Care | Payer: Medicare HMO | Source: Ambulatory Visit | Attending: General Surgery | Admitting: General Surgery

## 2023-05-12 ENCOUNTER — Other Ambulatory Visit (HOSPITAL_COMMUNITY): Payer: Self-pay

## 2023-05-12 ENCOUNTER — Ambulatory Visit (HOSPITAL_BASED_OUTPATIENT_CLINIC_OR_DEPARTMENT_OTHER): Payer: Self-pay | Admitting: Anesthesiology

## 2023-05-12 ENCOUNTER — Encounter (HOSPITAL_COMMUNITY)
Admission: RE | Disposition: A | Discharge status: Home / Self Care | Payer: Self-pay | Source: Home / Self Care | Attending: General Surgery

## 2023-05-12 ENCOUNTER — Other Ambulatory Visit: Payer: Self-pay

## 2023-05-12 ENCOUNTER — Ambulatory Visit (HOSPITAL_COMMUNITY): Payer: Self-pay | Admitting: Vascular Surgery

## 2023-05-12 ENCOUNTER — Ambulatory Visit (HOSPITAL_COMMUNITY)
Admission: RE | Admit: 2023-05-12 | Discharge: 2023-05-12 | Disposition: A | Discharge status: Home / Self Care | Payer: Medicare HMO | Attending: General Surgery | Admitting: General Surgery

## 2023-05-12 DIAGNOSIS — K219 Gastro-esophageal reflux disease without esophagitis: Secondary | ICD-10-CM | POA: Diagnosis not present

## 2023-05-12 DIAGNOSIS — Z87891 Personal history of nicotine dependence: Secondary | ICD-10-CM | POA: Diagnosis not present

## 2023-05-12 DIAGNOSIS — D242 Benign neoplasm of left breast: Secondary | ICD-10-CM | POA: Diagnosis not present

## 2023-05-12 DIAGNOSIS — Z01818 Encounter for other preprocedural examination: Secondary | ICD-10-CM

## 2023-05-12 DIAGNOSIS — E7800 Pure hypercholesterolemia, unspecified: Secondary | ICD-10-CM | POA: Diagnosis not present

## 2023-05-12 DIAGNOSIS — Z1721 Progesterone receptor positive status: Secondary | ICD-10-CM | POA: Diagnosis not present

## 2023-05-12 DIAGNOSIS — Z7984 Long term (current) use of oral hypoglycemic drugs: Secondary | ICD-10-CM | POA: Insufficient documentation

## 2023-05-12 DIAGNOSIS — Z170 Estrogen receptor positive status [ER+]: Secondary | ICD-10-CM | POA: Insufficient documentation

## 2023-05-12 DIAGNOSIS — Z79899 Other long term (current) drug therapy: Secondary | ICD-10-CM | POA: Diagnosis not present

## 2023-05-12 DIAGNOSIS — E119 Type 2 diabetes mellitus without complications: Secondary | ICD-10-CM | POA: Insufficient documentation

## 2023-05-12 DIAGNOSIS — C50912 Malignant neoplasm of unspecified site of left female breast: Secondary | ICD-10-CM | POA: Diagnosis not present

## 2023-05-12 DIAGNOSIS — C50512 Malignant neoplasm of lower-outer quadrant of left female breast: Secondary | ICD-10-CM | POA: Diagnosis not present

## 2023-05-12 DIAGNOSIS — I10 Essential (primary) hypertension: Secondary | ICD-10-CM | POA: Diagnosis not present

## 2023-05-12 DIAGNOSIS — D0512 Intraductal carcinoma in situ of left breast: Secondary | ICD-10-CM | POA: Diagnosis not present

## 2023-05-12 DIAGNOSIS — Z1732 Human epidermal growth factor receptor 2 negative status: Secondary | ICD-10-CM | POA: Diagnosis not present

## 2023-05-12 DIAGNOSIS — N62 Hypertrophy of breast: Secondary | ICD-10-CM | POA: Diagnosis not present

## 2023-05-12 HISTORY — PX: BREAST LUMPECTOMY WITH RADIOACTIVE SEED LOCALIZATION: SHX6424

## 2023-05-12 LAB — GLUCOSE, CAPILLARY
Glucose-Capillary: 176 mg/dL — ABNORMAL HIGH (ref 70–99)
Glucose-Capillary: 156 mg/dL — ABNORMAL HIGH (ref 70–99)
Glucose-Capillary: 184 mg/dL — ABNORMAL HIGH (ref 70–99)

## 2023-05-12 SURGERY — BREAST LUMPECTOMY WITH RADIOACTIVE SEED LOCALIZATION
Anesthesia: General | Site: Breast | Laterality: Left

## 2023-05-12 MED ORDER — KETOROLAC TROMETHAMINE 15 MG/ML IJ SOLN
INTRAMUSCULAR | Status: DC | PRN
  Administered 2023-05-12: 15 mg via INTRAVENOUS

## 2023-05-12 MED ORDER — CHLORHEXIDINE GLUCONATE 0.12 % MT SOLN
15.0000 mL | Freq: Once | OROMUCOSAL | Status: AC
  Administered 2023-05-12: 15 mL via OROMUCOSAL
  Filled 2023-05-12: qty 15

## 2023-05-12 MED ORDER — LIDOCAINE 2% (20 MG/ML) 5 ML SYRINGE
INTRAMUSCULAR | Status: AC
  Filled 2023-05-12: qty 5

## 2023-05-12 MED ORDER — FENTANYL CITRATE (PF) 100 MCG/2ML IJ SOLN
25.0000 ug | INTRAMUSCULAR | Status: DC | PRN
Start: 2023-05-12 — End: 2023-05-14

## 2023-05-12 MED ORDER — FENTANYL CITRATE (PF) 250 MCG/5ML IJ SOLN
INTRAMUSCULAR | Status: DC | PRN
  Administered 2023-05-12 (×4): 25 ug via INTRAVENOUS

## 2023-05-12 MED ORDER — ACETAMINOPHEN 500 MG PO TABS
1000.0000 mg | ORAL_TABLET | ORAL | Status: AC
  Administered 2023-05-12: 1000 mg via ORAL
  Filled 2023-05-12: qty 2

## 2023-05-12 MED ORDER — 0.9 % SODIUM CHLORIDE (POUR BTL) OPTIME
TOPICAL | Status: DC | PRN
  Administered 2023-05-12: 1000 mL

## 2023-05-12 MED ORDER — PROPOFOL 10 MG/ML IV BOLUS
INTRAVENOUS | Status: DC | PRN
  Administered 2023-05-12: 130 mg via INTRAVENOUS

## 2023-05-12 MED ORDER — TRAMADOL HCL 50 MG PO TABS
50.0000 mg | ORAL_TABLET | Freq: Four times a day (QID) | ORAL | 0 refills | Status: AC | PRN
  Filled 2023-05-12: qty 12, 3d supply, fill #0

## 2023-05-12 MED ORDER — CHLORHEXIDINE GLUCONATE CLOTH 2 % EX PADS: 6.0000 | MEDICATED_PAD | Freq: Once | CUTANEOUS | Status: DC

## 2023-05-12 MED ORDER — ONDANSETRON HCL 4 MG/2ML IJ SOLN
INTRAMUSCULAR | Status: AC
  Filled 2023-05-12: qty 2

## 2023-05-12 MED ORDER — ONDANSETRON HCL 4 MG/2ML IJ SOLN
INTRAMUSCULAR | Status: DC | PRN
  Administered 2023-05-12: 4 mg via INTRAVENOUS

## 2023-05-12 MED ORDER — ORAL CARE MOUTH RINSE: 15.0000 mL | Freq: Once | OROMUCOSAL | Status: AC

## 2023-05-12 MED ORDER — LIDOCAINE 2% (20 MG/ML) 5 ML SYRINGE
INTRAMUSCULAR | Status: DC | PRN
  Administered 2023-05-12: 40 mg via INTRAVENOUS

## 2023-05-12 MED ORDER — LIDOCAINE HCL (PF) 1 % IJ SOLN
INTRAMUSCULAR | Status: AC
  Filled 2023-05-12: qty 30

## 2023-05-12 MED ORDER — DEXAMETHASONE SODIUM PHOSPHATE 10 MG/ML IJ SOLN
INTRAMUSCULAR | Status: DC | PRN
  Administered 2023-05-12: 8 mg via INTRAVENOUS

## 2023-05-12 MED ORDER — LIDOCAINE HCL 1 % IJ SOLN
INTRAMUSCULAR | Status: DC | PRN
  Administered 2023-05-12: 60 mL

## 2023-05-12 MED ORDER — CEFAZOLIN SODIUM-DEXTROSE 2-4 GM/100ML-% IV SOLN
2.0000 g | INTRAVENOUS | Status: AC
  Administered 2023-05-12: 2 g via INTRAVENOUS
  Filled 2023-05-12: qty 100

## 2023-05-12 MED ORDER — LACTATED RINGERS IV SOLN: INTRAVENOUS | Status: DC

## 2023-05-12 MED ORDER — FENTANYL CITRATE (PF) 250 MCG/5ML IJ SOLN
INTRAMUSCULAR | Status: AC
  Filled 2023-05-12: qty 5

## 2023-05-12 MED ORDER — DEXAMETHASONE SODIUM PHOSPHATE 10 MG/ML IJ SOLN
INTRAMUSCULAR | Status: AC
  Filled 2023-05-12: qty 1

## 2023-05-12 MED ORDER — INSULIN ASPART 100 UNIT/ML IJ SOLN: 0.0000 [IU] | INTRAMUSCULAR | Status: DC | PRN

## 2023-05-12 MED ORDER — EPHEDRINE 5 MG/ML INJ
INTRAVENOUS | Status: AC
  Filled 2023-05-12: qty 5

## 2023-05-12 MED ORDER — KETOROLAC TROMETHAMINE 30 MG/ML IJ SOLN
INTRAMUSCULAR | Status: AC
  Filled 2023-05-12: qty 1

## 2023-05-12 MED ORDER — BUPIVACAINE-EPINEPHRINE (PF) 0.25% -1:200000 IJ SOLN
INTRAMUSCULAR | Status: AC
  Filled 2023-05-12: qty 30

## 2023-05-12 SURGICAL SUPPLY — 31 items
CHLORAPREP W/TINT 26 (MISCELLANEOUS) ×2 IMPLANT
CLIP TI LARGE 6 (CLIP) ×2 IMPLANT
COVER PROBE W GEL 5X96 (DRAPES) ×2 IMPLANT
COVER SURGICAL LIGHT HANDLE (MISCELLANEOUS) ×2 IMPLANT
DERMABOND ADVANCED .7 DNX12 (GAUZE/BANDAGES/DRESSINGS) ×2 IMPLANT
DEVICE DUBIN SPECIMEN MAMMOGRA (MISCELLANEOUS) ×2 IMPLANT
DRAPE CHEST BREAST 15X10 FENES (DRAPES) ×2 IMPLANT
ELECT COATED BLADE 2.86 ST (ELECTRODE) ×2 IMPLANT
ELECT REM PT RETURN 9FT ADLT (ELECTROSURGICAL) ×1
ELECTRODE REM PT RTRN 9FT ADLT (ELECTROSURGICAL) ×2 IMPLANT
GAUZE PAD ABD 8X10 STRL (GAUZE/BANDAGES/DRESSINGS) ×2 IMPLANT
GAUZE SPONGE 4X4 12PLY STRL LF (GAUZE/BANDAGES/DRESSINGS) ×2 IMPLANT
GLOVE BIO SURGEON STRL SZ 6 (GLOVE) ×2 IMPLANT
GLOVE INDICATOR 6.5 STRL GRN (GLOVE) ×2 IMPLANT
GOWN STRL REUS W/ TWL LRG LVL3 (GOWN DISPOSABLE) ×2 IMPLANT
GOWN STRL REUS W/ TWL XL LVL3 (GOWN DISPOSABLE) ×2 IMPLANT
KIT BASIN OR (CUSTOM PROCEDURE TRAY) ×2 IMPLANT
KIT MARKER MARGIN INK (KITS) ×2 IMPLANT
LIGHT WAVEGUIDE WIDE FLAT (MISCELLANEOUS) IMPLANT
NDL HYPO 25GX1X1/2 BEV (NEEDLE) ×2 IMPLANT
NEEDLE HYPO 25GX1X1/2 BEV (NEEDLE) ×1
NS IRRIG 1000ML POUR BTL (IV SOLUTION) IMPLANT
PACK GENERAL/GYN (CUSTOM PROCEDURE TRAY) ×2 IMPLANT
STRIP CLOSURE SKIN 1/2X4 (GAUZE/BANDAGES/DRESSINGS) ×2 IMPLANT
SUT MNCRL AB 4-0 PS2 18 (SUTURE) ×2 IMPLANT
SUT SILK 2 0 SH (SUTURE) IMPLANT
SUT VIC AB 2-0 SH 27XBRD (SUTURE) IMPLANT
SUT VIC AB 3-0 SH 27X BRD (SUTURE) ×2 IMPLANT
SYR CONTROL 10ML LL (SYRINGE) ×2 IMPLANT
TOWEL GREEN STERILE (TOWEL DISPOSABLE) ×2 IMPLANT
TOWEL GREEN STERILE FF (TOWEL DISPOSABLE) ×2 IMPLANT

## 2023-05-12 NOTE — Interval H&P Note (Signed)
 History and Physical Interval Note:  05/12/2023 9:10 AM  Michele Pace  has presented today for surgery, with the diagnosis of LEFT BREAST CANCER.  The various methods of treatment have been discussed with the patient and family. After consideration of risks, benefits and other options for treatment, the patient has consented to  Procedure(s): LEFT BREAST LUMPECTOMY WITH RADIOACTIVE SEED LOCALIZATION (Left) as a surgical intervention.  The patient's history has been reviewed, patient examined, no change in status, stable for surgery.  I have reviewed the patient's chart and labs.  Questions were answered to the patient's satisfaction.     Jina Nephew

## 2023-05-12 NOTE — Anesthesia Procedure Notes (Addendum)
 Procedure Name: LMA Insertion Date/Time: 05/12/2023 10:57 AM  Performed by: Worth Peppers, CRNAPre-anesthesia Checklist: Patient identified, Emergency Drugs available, Suction available and Patient being monitored Patient Re-evaluated:Patient Re-evaluated prior to induction Oxygen Delivery Method: Circle System Utilized Preoxygenation: Pre-oxygenation with 100% oxygen Induction Type: IV induction Ventilation: Mask ventilation without difficulty LMA: LMA inserted LMA Size: 4.0 Number of attempts: 1 Airway Equipment and Method: Bite block Placement Confirmation: positive ETCO2 Tube secured with: Tape Dental Injury: Teeth and Oropharynx as per pre-operative assessment

## 2023-05-12 NOTE — Transfer of Care (Signed)
 Immediate Anesthesia Transfer of Care Note  Patient: Michele Pace  Procedure(s) Performed: LEFT BREAST LUMPECTOMY WITH RADIOACTIVE SEED LOCALIZATION (Left: Breast)  Patient Location: PACU  Anesthesia Type:MAC  Level of Consciousness: awake, alert , and oriented  Airway & Oxygen Therapy: Patient Spontanous Breathing and Patient connected to face mask oxygen  Post-op Assessment: Report given to RN and Post -op Vital signs reviewed and stable  Post vital signs: Reviewed  Last Vitals:  Vitals Value Taken Time  BP 165/74 05/12/23 1210  Temp    Pulse 102 05/12/23 1213  Resp 19 05/12/23 1213  SpO2 96 % 05/12/23 1213  Vitals shown include unfiled device data.  Last Pain:  Vitals:   05/12/23 1211  TempSrc:   PainSc: 0-No pain         Complications: No notable events documented.

## 2023-05-12 NOTE — Discharge Instructions (Signed)
 [facility] Office Phone Number [phone]  BREAST BIOPSY/ PARTIAL MASTECTOMY: POST OP INSTRUCTIONS  Always review your discharge instruction sheet given to you by the facility where your surgery was performed.  IF YOU HAVE DISABILITY OR FAMILY LEAVE FORMS, YOU MUST BRING THEM TO THE OFFICE FOR PROCESSING.  DO NOT GIVE THEM TO YOUR DOCTOR.  Take 2 tylenol  (acetominophen) three times a day for 3 days.  If you still have pain, add ibuprofen with food in between if able to take this (if you have kidney issues or stomach issues, do not take ibuprofen).  If both of those are not enough, add the narcotic pain pill.  If you find you are needing a lot of this overnight after surgery, call the next morning for a refill.    Prescriptions will not be filled after 5pm or on week-ends. Take your usually prescribed medications unless otherwise directed You should eat very light the first 24 hours after surgery, such as soup, crackers, pudding, etc.  Resume your normal diet the day after surgery. Most patients will experience some swelling and bruising in the breast.  Ice packs and a good support bra will help.  Swelling and bruising can take several days to resolve.  It is common to experience some constipation if taking pain medication after surgery.  Increasing fluid intake and taking a stool softener will usually help or prevent this problem from occurring.  A mild laxative (Milk of Magnesia or Miralax ) should be taken according to package directions if there are no bowel movements after 48 hours. Unless discharge instructions indicate otherwise, you may remove your bandages 48 hours after surgery, and you may shower at that time.  You may have steri-strips (small skin tapes) in place directly over the incision.  These strips should be left on the skin at least for for 7-10 days.    ACTIVITIES:  You may resume regular daily activities (gradually increasing) beginning the next day.  Wearing a  good support bra or sports bra (or the breast binder) minimizes pain and swelling.  You may have sexual intercourse when it is comfortable. No heavy lifting for 1-2 weeks (not over around 10 pounds).  You may drive when you no longer are taking prescription pain medication, you can comfortably wear a seatbelt, and you can safely maneuver your car and apply brakes. RETURN TO WORK:  __________3-14 days depending on job. _______________ Michele Pace should see your doctor in the office for a follow-up appointment approximately two weeks after your surgery.  Your doctor's nurse will typically make your follow-up appointment when she calls you with your pathology report.  Expect your pathology report 3-4 business days after your surgery.  You may call to check if you do not hear from us  after three days.   WHEN TO CALL YOUR DOCTOR: Fever over 101.0 Nausea and/or vomiting. Extreme swelling or bruising. Continued bleeding from incision. Increased pain, redness, or drainage from the incision.  The clinic staff is available to answer your questions during regular business hours.  Please don't hesitate to call and ask to speak to one of the nurses for clinical concerns.  If you have a medical emergency, go to the nearest emergency room or call 911.  A surgeon from [facility] is always on call at the hospital.  For further questions, please visit [email or web address]

## 2023-05-12 NOTE — Progress Notes (Signed)
Patient daughter Johnny Bridge requesting Dr. Donell Beers call her after surgery

## 2023-05-12 NOTE — Op Note (Signed)
 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 The risks, benefits, complications, treatment options, and expected outcomes were discussed with the patient. The possibilities of bleeding, infection, the need for additional procedures, failure to diagnose a condition, and creating a complication requiring other procedures or operations were discussed with the patient. The patient concurred with the proposed plan, giving informed consent.  The site of surgery properly noted/marked. The patient was taken to Operating Room # 2, identified, and the procedure verified as left breast seed localized lumpectomy.  The left breast and chest were prepped and draped in standard fashion. A inferolateral circumareolar incision was made near the previously placed radioactive seed.  Dissection was carried down around the point of maximum signal intensity. The cautery was used to perform the dissection.   The specimen was inked with the margin marker paint kit.    Specimen radiography confirmed inclusion of the mammographic lesion, the clip, and the seed.  The background signal in the breast was zero.  3D mammogram showed the clip and seed fairly close to several of the margins, so shave margins were taken on all borders.   Hemostasis was achieved with cautery.  The cavity was marked with clips on each border other than the anterior border.  The wound was irrigated and closed with 3-0 vicryl interrupted deep dermal sutures and 4-0 monocryl running subcuticular suture.      Sterile  dressings were applied. At the end of the operation, all sponge, instrument, and needle counts were correct.   Findings: Seed, clip in specimen.  anterior margin was skin.     Estimated Blood Loss:  min         Specimens: left breast tissue with seed, additional medial, superior, lateral, inferior, posterior, and anterior margins.           Complications:  None; patient tolerated the procedure well.         Disposition: PACU - hemodynamically stable.         Condition: stable

## 2023-05-13 ENCOUNTER — Encounter (HOSPITAL_COMMUNITY): Payer: Self-pay | Admitting: General Surgery

## 2023-05-13 DIAGNOSIS — C50512 Malignant neoplasm of lower-outer quadrant of left female breast: Secondary | ICD-10-CM | POA: Diagnosis not present

## 2023-05-15 LAB — SURGICAL PATHOLOGY

## 2023-05-15 NOTE — Anesthesia Postprocedure Evaluation (Signed)
 Anesthesia Post Note  Patient: Michele Pace  Procedure(s) Performed: LEFT BREAST LUMPECTOMY WITH RADIOACTIVE SEED LOCALIZATION (Left: Breast)     Patient location during evaluation: PACU Anesthesia Type: General Level of consciousness: awake and alert Pain management: pain level controlled Vital Signs Assessment: post-procedure vital signs reviewed and stable Respiratory status: spontaneous breathing, nonlabored ventilation, respiratory function stable and patient connected to nasal cannula oxygen Cardiovascular status: blood pressure returned to baseline and stable Postop Assessment: no apparent nausea or vomiting Anesthetic complications: no   No notable events documented.  Last Vitals:  Vitals:   05/12/23 1300 05/12/23 1315  BP: (!) 182/75 (!) 169/84  Pulse: 79 75  Resp: 17 18  Temp:  36.7 C  SpO2: 99% 97%    Last Pain:  Vitals:   05/12/23 1315  TempSrc:   PainSc: 0-No pain                 Rosio Weiss

## 2023-05-20 DIAGNOSIS — Z170 Estrogen receptor positive status [ER+]: Secondary | ICD-10-CM | POA: Insufficient documentation

## 2023-05-20 DIAGNOSIS — C50912 Malignant neoplasm of unspecified site of left female breast: Secondary | ICD-10-CM | POA: Diagnosis not present

## 2023-05-20 DIAGNOSIS — I10 Essential (primary) hypertension: Secondary | ICD-10-CM | POA: Diagnosis not present

## 2023-05-20 DIAGNOSIS — R413 Other amnesia: Secondary | ICD-10-CM | POA: Diagnosis not present

## 2023-05-20 DIAGNOSIS — E1169 Type 2 diabetes mellitus with other specified complication: Secondary | ICD-10-CM | POA: Diagnosis not present

## 2023-05-20 DIAGNOSIS — Z299 Encounter for prophylactic measures, unspecified: Secondary | ICD-10-CM | POA: Diagnosis not present

## 2023-05-22 DIAGNOSIS — C50812 Malignant neoplasm of overlapping sites of left female breast: Secondary | ICD-10-CM | POA: Diagnosis not present

## 2023-05-22 DIAGNOSIS — C50912 Malignant neoplasm of unspecified site of left female breast: Secondary | ICD-10-CM | POA: Diagnosis not present

## 2023-05-22 DIAGNOSIS — Z170 Estrogen receptor positive status [ER+]: Secondary | ICD-10-CM | POA: Diagnosis not present

## 2023-06-16 DIAGNOSIS — N390 Urinary tract infection, site not specified: Secondary | ICD-10-CM | POA: Diagnosis not present

## 2023-06-16 DIAGNOSIS — E1169 Type 2 diabetes mellitus with other specified complication: Secondary | ICD-10-CM | POA: Diagnosis not present

## 2023-06-16 DIAGNOSIS — I10 Essential (primary) hypertension: Secondary | ICD-10-CM | POA: Diagnosis not present

## 2023-06-16 DIAGNOSIS — Z299 Encounter for prophylactic measures, unspecified: Secondary | ICD-10-CM | POA: Diagnosis not present

## 2023-06-16 DIAGNOSIS — Z23 Encounter for immunization: Secondary | ICD-10-CM | POA: Diagnosis not present

## 2023-06-17 DIAGNOSIS — N390 Urinary tract infection, site not specified: Secondary | ICD-10-CM | POA: Diagnosis not present

## 2023-06-18 DIAGNOSIS — N390 Urinary tract infection, site not specified: Secondary | ICD-10-CM | POA: Diagnosis not present

## 2023-07-09 DIAGNOSIS — Z170 Estrogen receptor positive status [ER+]: Secondary | ICD-10-CM | POA: Diagnosis not present

## 2023-07-09 DIAGNOSIS — C50812 Malignant neoplasm of overlapping sites of left female breast: Secondary | ICD-10-CM | POA: Diagnosis not present

## 2023-07-09 DIAGNOSIS — C50512 Malignant neoplasm of lower-outer quadrant of left female breast: Secondary | ICD-10-CM | POA: Diagnosis not present

## 2023-08-07 DIAGNOSIS — Z0000 Encounter for general adult medical examination without abnormal findings: Secondary | ICD-10-CM | POA: Diagnosis not present

## 2023-08-07 DIAGNOSIS — Z7189 Other specified counseling: Secondary | ICD-10-CM | POA: Diagnosis not present

## 2023-08-07 DIAGNOSIS — F410 Panic disorder [episodic paroxysmal anxiety] without agoraphobia: Secondary | ICD-10-CM | POA: Diagnosis not present

## 2023-08-07 DIAGNOSIS — Z1339 Encounter for screening examination for other mental health and behavioral disorders: Secondary | ICD-10-CM | POA: Diagnosis not present

## 2023-08-07 DIAGNOSIS — Z299 Encounter for prophylactic measures, unspecified: Secondary | ICD-10-CM | POA: Diagnosis not present

## 2023-08-07 DIAGNOSIS — I10 Essential (primary) hypertension: Secondary | ICD-10-CM | POA: Diagnosis not present

## 2023-08-07 DIAGNOSIS — R5383 Other fatigue: Secondary | ICD-10-CM | POA: Diagnosis not present

## 2023-08-07 DIAGNOSIS — Z79899 Other long term (current) drug therapy: Secondary | ICD-10-CM | POA: Diagnosis not present

## 2023-08-07 DIAGNOSIS — E1169 Type 2 diabetes mellitus with other specified complication: Secondary | ICD-10-CM | POA: Diagnosis not present

## 2023-08-07 DIAGNOSIS — Z1331 Encounter for screening for depression: Secondary | ICD-10-CM | POA: Diagnosis not present

## 2023-08-07 DIAGNOSIS — E7800 Pure hypercholesterolemia, unspecified: Secondary | ICD-10-CM | POA: Diagnosis not present

## 2023-09-15 DIAGNOSIS — I10 Essential (primary) hypertension: Secondary | ICD-10-CM | POA: Diagnosis not present

## 2023-09-15 DIAGNOSIS — C50912 Malignant neoplasm of unspecified site of left female breast: Secondary | ICD-10-CM | POA: Diagnosis not present

## 2023-09-15 DIAGNOSIS — Z299 Encounter for prophylactic measures, unspecified: Secondary | ICD-10-CM | POA: Diagnosis not present

## 2023-09-15 DIAGNOSIS — E1169 Type 2 diabetes mellitus with other specified complication: Secondary | ICD-10-CM | POA: Diagnosis not present

## 2023-09-28 ENCOUNTER — Encounter: Payer: Self-pay | Admitting: Internal Medicine

## 2023-10-26 ENCOUNTER — Ambulatory Visit (INDEPENDENT_AMBULATORY_CARE_PROVIDER_SITE_OTHER)

## 2023-10-26 ENCOUNTER — Ambulatory Visit
Admission: EM | Admit: 2023-10-26 | Discharge: 2023-10-26 | Disposition: A | Discharge status: Home / Self Care | Attending: Family Medicine | Admitting: Family Medicine

## 2023-10-26 DIAGNOSIS — S82832A Other fracture of upper and lower end of left fibula, initial encounter for closed fracture: Secondary | ICD-10-CM

## 2023-10-26 DIAGNOSIS — S82432A Displaced oblique fracture of shaft of left fibula, initial encounter for closed fracture: Secondary | ICD-10-CM | POA: Diagnosis not present

## 2023-10-26 DIAGNOSIS — R55 Syncope and collapse: Secondary | ICD-10-CM | POA: Diagnosis not present

## 2023-10-26 NOTE — ED Provider Notes (Signed)
 [facility]    CSN: [id number] Arrival date & time: 10/26/23  9167      History   Chief Complaint No chief complaint on file.   HPI Michele Pace is a 83 y.o. female.   Patient presenting today with several day history of left foot and ankle pain, bruising, swelling particular with weightbearing after a fall at [facility] [employer] several days ago.  She states she got overheated and passed out, EMTs came and evaluated her but ultimately she decided to not go to the emergency department.  She has been trying over-the-counter remedies and using her cane to ambulate but states the pain continues to get worse.  Denies numbness, tingling, leg weakness, other injuries from the fall and denies any concerns regarding the recent syncopal episode or recurring symptoms related to this.    Past Medical History:  Diagnosis Date   Cancer [facility])    Left Breast Cancer - lower-outer quadrant of left breast   Diabetes (HCC)    type 2   GERD (gastroesophageal reflux disease)    Gout    no current problem   Headache    no current problem   HTN (hypertension)    Hx of adenomatous colonic polyps    Hypercholesteremia    Seasonal allergies     Patient Active Problem List   Diagnosis Date Noted   Abnormal CT of the pancreas 03/20/2020   Abnormal MRI of pancreas 03/20/2020   Dilated pancreatic duct 03/20/2020   Idiopathic chronic pancreatitis (HCC) 03/20/2020   Exocrine pancreatic insufficiency 03/20/2020   GERD (gastroesophageal reflux disease)    Lung nodule    Change in bowel function 07/08/2017   Colicky RLQ abdominal pain 07/08/2017   Abdominal bloating 07/08/2017   Diverticulosis of colon without hemorrhage    ALLERGIC RHINITIS, SEASONAL 03/09/2009   GASTROESOPHAGEAL REFLUX DISEASE, CHRONIC 03/09/2009   Diaphragmatic hernia 03/09/2009   Diverticulosis of colon 03/09/2009   History of colonic polyps 03/09/2009    Past Surgical History:  Procedure Laterality Date    ABDOMINAL HYSTERECTOMY     BIOPSY  08/09/2020   Procedure: BIOPSY;  Surgeon: Wilhelmenia Aloha Raddle., MD;  Location: [facility] ENDOSCOPY;  Service: Gastroenterology;;   BREAST BIOPSY Left 04/03/2023   US  LT BREAST BX W LOC DEV 1ST LESION IMG BX SPEC US  GUIDE 04/03/2023 [facility] MAMMOGRAPHY   BREAST BIOPSY  05/11/2023   MM LT RADIOACTIVE SEED LOC MAMMO GUIDE 05/11/2023 [facility] MAMMOGRAPHY   BREAST LUMPECTOMY WITH RADIOACTIVE SEED LOCALIZATION Left 05/12/2023   Procedure: LEFT BREAST LUMPECTOMY WITH RADIOACTIVE SEED LOCALIZATION;  Surgeon: Aron Shoulders, MD;  Location: [facility] OR;  Service: General;  Laterality: Left;   CATARACT EXTRACTION W/PHACO Left 06/14/2018   Procedure: CATARACT EXTRACTION PHACO AND INTRAOCULAR LENS PLACEMENT (IOC);  Surgeon: Harrie Agent, MD;  Location: [facility] ORS;  Service: Ophthalmology;  Laterality: Left;  CDE: 4.13   CATARACT EXTRACTION W/PHACO Right 03/19/2020   Procedure: CATARACT EXTRACTION PHACO AND INTRAOCULAR LENS PLACEMENT (IOC);  Surgeon: Harrie Agent, MD;  Location: [facility] ORS;  Service: Ophthalmology;  Laterality: Right;  CDE 10.01   COLONOSCOPY  10/25/07   RMR: normal rectum left sided diverticula   COLONOSCOPY N/A 04/26/2014   Dr. Shaaron: There are sigmoid diverticula, 3 mm polyp removed from the descending segment, tubular adenoma.  Next colonoscopy in 7 years   ESOPHAGOGASTRODUODENOSCOPY  10/25/07   MFM:ejulonld esophagogasteic junction focal antral erosions and bular erosions, otherwise normal. mild chronic gastritis.    ESOPHAGOGASTRODUODENOSCOPY (EGD) WITH PROPOFOL  N/A 08/09/2020  Procedure: ESOPHAGOGASTRODUODENOSCOPY (EGD) WITH PROPOFOL ;  Surgeon: Wilhelmenia Aloha Raddle., MD;  Location: [facility] ENDOSCOPY;  Service: Gastroenterology;  Laterality: N/A;   EUS N/A 08/09/2020   Procedure: UPPER ENDOSCOPIC ULTRASOUND (EUS) RADIAL;  Surgeon: Wilhelmenia Aloha Raddle., MD;  Location: [facility] ENDOSCOPY;  Service: Gastroenterology;  Laterality: N/A;   PARATHYROIDECTOMY  1999/2000   removed two    OB  History   No obstetric history on file.      Home Medications    Prior to Admission medications   Medication Sig Start Date End Date Taking? Authorizing Provider  alfuzosin  (UROXATRAL ) 10 MG 24 hr tablet Take 1 tablet (10 mg total) by mouth daily with breakfast. 10/21/22   McKenzie, Belvie CROME, MD  allopurinol (ZYLOPRIM) 300 MG tablet Take 300 mg by mouth daily.     [provider]  amLODipine-benazepril (LOTREL) 5-20 MG per capsule Take 1 capsule by mouth daily.     [provider]  Ascorbic Acid (VITAMIN C) 1000 MG tablet Take 1,000 mg by mouth daily.     [provider]  aspirin 81 MG tablet Take 81 mg by mouth daily.    [provider]  b complex vitamins tablet Take 1 tablet by mouth daily.    [provider]  calcium citrate (CALCITRATE - DOSED IN MG ELEMENTAL CALCIUM) 950 MG tablet Take 600 mg by mouth daily.    [provider]  Carboxymethylcellulose Sodium (THERATEARS) 0.25 % SOLN Place 1 drop into both eyes in the morning.    [provider]  Cholecalciferol (VITAMIN D3) 125 MCG (5000 UT) TABS Take 5,000 Units by mouth daily.    [provider]  Coenzyme Q10 (CO Q 10 PO) Take 200 mg by mouth daily.     [provider]  Cranberry 500 MG TABS Take 500 mg by mouth daily.     [provider]  Cyanocobalamin (B-12) 2500 MCG TABS Take 2,500 mcg by mouth daily.    [provider]  docusate sodium (COLACE) 100 MG capsule Take 200 mg by mouth daily.    [provider]  fluticasone (FLONASE) 50 MCG/ACT nasal spray Place 2 sprays into both nostrils at bedtime.     [provider]  lipase/protease/amylase (CREON ) 36000 UNITS CPEP capsule Take 2 capsules with each meal. Take 1 capsule with each snack( up 2 snacks per day) Patient not taking: Reported on 05/05/2023 06/12/22   Mansouraty, Gabriel Jr., MD  loratadine (CLARITIN) 10 MG tablet Take 10 mg by mouth daily.    [provider]  metFORMIN (GLUCOPHAGE) 500 MG tablet Take 500 mg by mouth 2 (two) times daily with a meal.    [provider]  omega-3 acid ethyl esters (LOVAZA) 1 G capsule Take 1 g by mouth daily.    [provider]  Omega-3 Fatty Acids (FISH OIL) 1200 MG CAPS Take 1,200 mg by mouth daily.    [provider]  omeprazole (PRILOSEC) 40 MG capsule Take 40 mg by mouth daily.     [provider]  polycarbophil (FIBERCON) 625 MG tablet Take 625 mg by mouth daily after breakfast.    [provider]  Polyethylene Glycol 3350  (CLEARLAX PO) Take 8.5 g by mouth daily.    [provider]  pravastatin (PRAVACHOL) 20 MG tablet Take 20 mg by mouth at bedtime.     [provider]  Probiotic Product (PROBIOTIC PO) Take 1 tablet by mouth daily.    [provider]  pyridOXINE (  VITAMIN B6) 100 MG tablet Take 100 mg by mouth daily.    [provider]  traMADol  (ULTRAM ) 50 MG tablet Take 1 tablet (50 mg total) by mouth every 6 (six) hours as needed for moderate pain (pain score 4-6) or severe pain (pain score 7-10). 05/12/23   Aron Shoulders, MD  Vitamin E 180 MG (400 UNIT) CAPS Take 180 Units by mouth daily.    [provider]  zinc gluconate 50 MG tablet Take 50 mg by mouth daily.    [provider]    Family History Family History  Problem Relation Age of Onset   Diabetes Mother    Heart disease Sister    Colon cancer Paternal Aunt    Heart disease Paternal Aunt    Heart disease Paternal Uncle    Skin cancer Brother    Heart disease Brother    Heart disease Other    Esophageal cancer Neg Hx    Stomach cancer Neg Hx    Inflammatory bowel disease Neg Hx    Liver disease Neg Hx    Pancreatic cancer Neg Hx    Breast cancer Neg Hx     Social History Social History   Tobacco Use   Smoking status: Former    Current packs/day: 0.25    Average packs/day: 0.3 packs/day for 40.0 years (10.0 ttl pk-yrs)     Types: Cigarettes   Smokeless tobacco: Never   Tobacco comments:    Quit smoking 10 yrs ago per patient on 05/08/23.  Vaping Use   Vaping status: Never Used  Substance Use Topics   Alcohol  use: No    Alcohol /week: 0.0 standard drinks of alcohol    Drug use: No     Allergies   Codeine and Sulfa antibiotics   Review of Systems Review of Systems Per HPI  Physical Exam Triage Vital Signs ED Triage Vitals  Encounter Vitals Group     BP 10/26/23 0939 (!) 160/84     Girls Systolic BP Percentile --      Girls Diastolic BP Percentile --      Boys Systolic BP Percentile --      Boys Diastolic BP Percentile --      Pulse Rate 10/26/23 0939 76     Resp 10/26/23 0939 14     Temp 10/26/23 0939 98.3 F (36.8 C)     Temp Source 10/26/23 0939 Oral     SpO2 10/26/23 0939 96 %     Weight --      Height --      Head Circumference --      Peak Flow --      Pain Score 10/26/23 0941 9     Pain Loc --      Pain Education --      Exclude from Growth Chart --    No data found.  Updated Vital Signs BP (!) 160/84 (BP Location: Right Arm)   Pulse 76   Temp 98.3 F (36.8 C) (Oral)   Resp 14   LMP  (LMP Unknown)   SpO2 96%   Visual Acuity Right Eye Distance:   Left Eye Distance:   Bilateral Distance:    Right Eye Near:   Left Eye Near:    Bilateral Near:     Physical Exam Vitals and nursing note reviewed.  Constitutional:      Appearance: Normal appearance. She is not ill-appearing.  HENT:     Head: Atraumatic.  Eyes:  Extraocular Movements: Extraocular movements intact.     Conjunctiva/sclera: Conjunctivae normal.  Cardiovascular:     Rate and Rhythm: Normal rate.  Pulmonary:     Effort: Pulmonary effort is normal.  Musculoskeletal:        General: Swelling, tenderness and signs of injury present. No deformity. Normal range of motion.     Cervical back: Normal range of motion and neck supple.     Comments: Left lateral ankle significantly bruised, edematous, tender  to palpation.  Decreased range of motion due to pain.  Patient currently in wheelchair due to pain with ambulation  Skin:    General: Skin is warm and dry.     Findings: Bruising present.  Neurological:     Mental Status: She is alert. Mental status is at baseline.     Comments: Left lower extremity neurovascularly intact  Psychiatric:        Mood and Affect: Mood normal.        Thought Content: Thought content normal.        Judgment: Judgment normal.      UC Treatments / Results  Labs (all labs ordered are listed, but only abnormal results are displayed) Labs Reviewed - No data to display  EKG   Radiology DG Ankle Complete Left Result Date: 10/26/2023 CLINICAL DATA:  Pain and swelling after syncope with fall EXAM: LEFT ANKLE COMPLETE - 3 VIEW; LEFT FOOT - COMPLETE 3 VIEW COMPARISON:  None Available. FINDINGS: Oblique fracture of the distal fibular diaphysis at the level of the syndesmosis with 1 cortical width lateral displacement. No acute dislocation. No joint effusion. Ankle mortise is grossly intact. Soft tissue swelling over the lateral malleolus. IMPRESSION: Minimally displaced oblique fracture of the distal fibular diaphysis at the level of the syndesmosis (Weber type B). Electronically Signed   By: Limin  Xu M.D.   On: 10/26/2023 10:16   DG Foot Complete Left Result Date: 10/26/2023 CLINICAL DATA:  Pain and swelling after syncope with fall EXAM: LEFT ANKLE COMPLETE - 3 VIEW; LEFT FOOT - COMPLETE 3 VIEW COMPARISON:  None Available. FINDINGS: Oblique fracture of the distal fibular diaphysis at the level of the syndesmosis with 1 cortical width lateral displacement. No acute dislocation. No joint effusion. Ankle mortise is grossly intact. Soft tissue swelling over the lateral malleolus. IMPRESSION: Minimally displaced oblique fracture of the distal fibular diaphysis at the level of the syndesmosis (Weber type B). Electronically Signed   By: Limin  Xu M.D.   On: 10/26/2023 10:16     Procedures Procedures (including critical care time)  Medications Ordered in UC Medications - No data to display  Initial Impression / Assessment and Plan / UC Course  I have reviewed the triage vital signs and the nursing notes.  Pertinent labs & imaging results that were available during my care of the patient were reviewed by me and considered in my medical decision making (see chart for details).     X-ray of the left foot and ankle showing a minimally displaced fracture of the distal fibula.  Placed in a boot for immobilization, given crutches to remain nonweightbearing and discussed close orthopedic follow-up.  RICE per call, over-the-counter pain relievers reviewed.  Final Clinical Impressions(s) / UC Diagnoses   Final diagnoses:  Closed fracture of distal end of left fibula, unspecified fracture morphology, initial encounter     Discharge Instructions      Wear the boot and remain nonweightbearing until following up with orthopedics for further recommendations.  Elevate at  rest to help with swelling, ibuprofen and Tylenol  as needed.    ED Prescriptions   None    PDMP not reviewed this encounter.   Jhanvi, [place], [place] 10/26/23 [phone]

## 2023-10-26 NOTE — Discharge Instructions (Signed)
 Wear the boot and remain nonweightbearing until following up with orthopedics for further recommendations.  Elevate at rest to help with swelling, ibuprofen and Tylenol  as needed.

## 2023-10-26 NOTE — ED Triage Notes (Signed)
 Pt reports pain swelling and bruising to the left foot, after fall at [employer]  after passing out.

## 2023-10-30 DIAGNOSIS — Z299 Encounter for prophylactic measures, unspecified: Secondary | ICD-10-CM | POA: Diagnosis not present

## 2023-10-30 DIAGNOSIS — W19XXXA Unspecified fall, initial encounter: Secondary | ICD-10-CM | POA: Diagnosis not present

## 2023-10-30 DIAGNOSIS — N390 Urinary tract infection, site not specified: Secondary | ICD-10-CM | POA: Diagnosis not present

## 2023-10-30 DIAGNOSIS — I10 Essential (primary) hypertension: Secondary | ICD-10-CM | POA: Diagnosis not present

## 2023-10-30 DIAGNOSIS — G4700 Insomnia, unspecified: Secondary | ICD-10-CM | POA: Diagnosis not present

## 2023-10-30 DIAGNOSIS — E1151 Type 2 diabetes mellitus with diabetic peripheral angiopathy without gangrene: Secondary | ICD-10-CM | POA: Diagnosis not present

## 2023-11-05 ENCOUNTER — Other Ambulatory Visit (INDEPENDENT_AMBULATORY_CARE_PROVIDER_SITE_OTHER): Payer: Self-pay

## 2023-11-05 ENCOUNTER — Ambulatory Visit: Admitting: Orthopaedic Surgery

## 2023-11-05 ENCOUNTER — Encounter: Payer: Self-pay | Admitting: Orthopaedic Surgery

## 2023-11-05 VITALS — Wt 145.5 lb

## 2023-11-05 DIAGNOSIS — S8265XA Nondisplaced fracture of lateral malleolus of left fibula, initial encounter for closed fracture: Secondary | ICD-10-CM

## 2023-11-05 DIAGNOSIS — M25572 Pain in left ankle and joints of left foot: Secondary | ICD-10-CM

## 2023-11-05 NOTE — Progress Notes (Signed)
 Subjective:    Patient ID: Michele Pace, female    DOB: 1940/07/09, 83 y.o.   MRN: [id number]  HPI She fell at [facility] around 10-24-23 after paying a bill.  She hurt her left ankle.  She went home after being evaluated by EMT.  They felt she was overheated and then passed out.  She did not go to the ER that day.  On 10-26-23 she went to [facility].  X-rays showed fracture of the left lateral malleolus.  She was given a CAM walker and referred here.  She has a regular walker.  She is not taking anything for pain as she says the ankle does not hurt much.    I have reviewed the urgent care notes and the X-rays.  I have independently reviewed and interpreted x-rays of this patient done at another site by another physician or qualified health professional.    Review of Systems  Constitutional:  Positive for activity change.  Musculoskeletal:  Positive for arthralgias, gait problem and joint swelling.  All other systems reviewed and are negative. For Review of Systems, all other systems reviewed and are negative.  The following is a summary of the past history medically, past history surgically, known current medicines, social history and family history.  This information is gathered electronically by the computer from prior information and documentation.  I review this each visit and have found including this information at this point in the chart is beneficial and informative.   Past Medical History:  Diagnosis Date   Cancer (HCC)    Left Breast Cancer - lower-outer quadrant of left breast   Diabetes (HCC)    type 2   GERD (gastroesophageal reflux disease)    Gout    no current problem   Headache    no current problem   HTN (hypertension)    Hx of adenomatous colonic polyps    Hypercholesteremia    Seasonal allergies     Past Surgical History:  Procedure Laterality Date   ABDOMINAL HYSTERECTOMY     BIOPSY  08/09/2020   Procedure: BIOPSY;  Surgeon:  Wilhelmenia Aloha Raddle., MD;  Location: [facility] ENDOSCOPY;  Service: Gastroenterology;;   BREAST BIOPSY Left 04/03/2023   US  LT BREAST BX W LOC DEV 1ST LESION IMG BX SPEC US  GUIDE 04/03/2023 [facility] MAMMOGRAPHY   BREAST BIOPSY  05/11/2023   MM LT RADIOACTIVE SEED LOC MAMMO GUIDE 05/11/2023 [facility] M[facility]MOGR[facility]   BREAST LUMPECTOMY WITH RADIOACTIVE SEED LOCALIZATION Left 05/12/2023   Procedure: LEFT BREAST LUMPECTOMY WITH RADIOACTIVE SEED LOCALIZATION;  Surgeon: Aron Shoulders, MD;  Location: [facility] OR;  Service: General;  Laterality: Left;   CATARACT EXTRACTION W/PHACO Left 06/14/2018   Procedure: CATARACT EXTRACTION PHACO AND INTRAOCULAR LENS PLACEMENT (IOC);  Surgeon: Harrie Agent, MD;  Location: [facility] ORS;  Service: Ophthalmology;  Laterality: Left;  CDE: 4.13   CATARACT EXTRACTION W/PHACO Right 03/19/2020   Procedure: CATARACT EXTRACTION PHACO AND INTRAOCULAR LENS PLACEMENT (IOC);  Surgeon: Harrie Agent, MD;  Location: [facility]S;  Service: Ophthalmology;  Laterality: Right;  CDE 10.01   COLONOSCOPY  10/25/07   RMR: normal rectum left sided diverticula   COLONOSCOPY N/A 04/26/2014   Dr. Shaaron: There are sigmoid diverticula, 3 mm polyp removed from the descending segment, tubular adenoma.  Next colonoscopy in 7 years   ESOPHAGOGASTRODUODENOSCOPY  10/25/07   MFM:ejulonld esophagogasteic junction focal antral erosions and bular erosions, otherwise normal. mild chronic gastritis.    ESOPHAGOGASTRODUODENOSCOPY (EGD) WITH PROPOFOL  N/A 08/09/2020  Procedure: ESOPHAGOGASTRODUODENOSCOPY (EGD) WITH PROPOFOL ;  Surgeon: Wilhelmenia Aloha Raddle., MD;  Location: [facility] ENDOSCOPY;  Service: Gastroenterology;  Laterality: N/A;   EUS N/A 08/09/2020   Procedure: UPPER ENDOSCOPIC ULTRASOUND (EUS) RADIAL;  Surgeon: Wilhelmenia Aloha Raddle., MD;  Location: [facility] ENDOSCOPY;  Service: Gastroenterology;  Laterality: N/A;   PARATHYROIDECTOMY  1999/2000   removed two    Current Outpatient Medications on File Prior to Visit  Medication Sig Dispense  Refill   alfuzosin  (UROXATRAL ) 10 MG 24 hr tablet Take 1 tablet (10 mg total) by mouth daily with breakfast. (Patient not taking: Reported on 11/05/2023) 30 tablet 5   allopurinol (ZYLOPRIM) 300 MG tablet Take 300 mg by mouth daily.      amLODipine-benazepril (LOTREL) 5-20 MG per capsule Take 1 capsule by mouth daily.      Ascorbic Acid (VITAMIN C) 1000 MG tablet Take 1,000 mg by mouth daily.      aspirin 81 MG tablet Take 81 mg by mouth daily.     b complex vitamins tablet Take 1 tablet by mouth daily.     calcium citrate (CALCITRATE - DOSED IN MG ELEMENTAL CALCIUM) 950 MG tablet Take 600 mg by mouth daily.     Carboxymethylcellulose Sodium (THERATEARS) 0.25 % SOLN Place 1 drop into both eyes in the morning.     Cholecalciferol (VITAMIN D3) 125 MCG (5000 UT) TABS Take 5,000 Units by mouth daily.     Coenzyme Q10 (CO Q 10 PO) Take 200 mg by mouth daily.      Cranberry 500 MG TABS Take 500 mg by mouth daily.      Cyanocobalamin (B-12) 2500 MCG TABS Take 2,500 mcg by mouth daily.     docusate sodium (COLACE) 100 MG capsule Take 200 mg by mouth daily.     fluticasone (FLONASE) 50 MCG/ACT nasal spray Place 2 sprays into both nostrils at bedtime.      lipase/protease/amylase (CREON ) 36000 UNITS CPEP capsule Take 2 capsules with each meal. Take 1 capsule with each snack( up 2 snacks per day) (Patient not taking: Reported on 05/05/2023) 300 capsule 11   loratadine (CLARITIN) 10 MG tablet Take 10 mg by mouth daily.     metFORMIN (GLUCOPHAGE) 500 MG tablet Take 500 mg by mouth 2 (two) times daily with a meal.     omega-3 acid ethyl esters (LOVAZA) 1 G capsule Take 1 g by mouth daily.     Omega-3 Fatty Acids (FISH OIL) 1200 MG CAPS Take 1,200 mg by mouth daily.     omeprazole (PRILOSEC) 40 MG capsule Take 40 mg by mouth daily.      polycarbophil (FIBERCON) 625 MG tablet Take 625 mg by mouth daily after breakfast.     Polyethylene Glycol 3350  (CLEARLAX PO) Take 8.5 g by mouth daily.     pravastatin  (PRAVACHOL) 20 MG tablet Take 20 mg by mouth at bedtime.      Probiotic Product (PROBIOTIC PO) Take 1 tablet by mouth daily.     pyridOXINE (VITAMIN B6) 100 MG tablet Take 100 mg by mouth daily.     traMADol  (ULTRAM ) 50 MG tablet Take 1 tablet (50 mg total) by mouth every 6 (six) hours as needed for moderate pain (pain score 4-6) or severe pain (pain score 7-10). 12 tablet 0   Vitamin E 180 MG (400 UNIT) CAPS Take 180 Units by mouth daily.     zinc gluconate 50 MG tablet Take 50 mg by mouth daily.     No current facility-administered  medications on file prior to visit.    Social History   Socioeconomic History   Marital status: Widowed    Spouse name: Not on file   Number of children: Not on file   Years of education: Not on file   Highest education level: Not on file  Occupational History   Occupation: retired Runner, broadcasting/film/video  Tobacco Use   Smoking status: Former    Current packs/day: 0.25    Average packs/day: 0.3 packs/day for 40.0 years (10.0 ttl pk-yrs)    Types: Cigarettes   Smokeless tobacco: Never   Tobacco comments:    Quit smoking 10 yrs ago per patient on 05/08/23.  Vaping Use   Vaping status: Never Used  Substance and Sexual Activity   Alcohol  use: No    Alcohol /week: 0.0 standard drinks of alcohol    Drug use: No   Sexual activity: Not Currently    Birth control/protection: Surgical    Comment: Hysterectomy  Other Topics Concern   Not on file  Social History Narrative   Adopted daughter   Social Drivers of Health   Financial Resource Strain: Low Risk  (07/09/2023)   Received [facility]   Overall Financial Resource Strain (CARDIA)    Difficulty of Paying Living Expenses: Not hard at all  Food Insecurity: No Food Insecurity (07/09/2023)   Received from [facility]   Hunger Vital Sign    Within the past 12 months, you worried that your food would run out before you got the money to buy more.: Never true    Within the past 12 months, the food you bought  just didn't last and you didn't have money to get more.: Never true  Transportation Needs: No Transportation Needs (07/09/2023)   Received from [facility]   PRAPARE - Transportation    Lack of Transportation (Medical): No    Lack of Transportation (Non-Medical): No  Physical Activity: Inactive (07/09/2023)   Received from [facility]   Exercise Vital Sign    On average, how many days per week do you engage in moderate to strenuous exercise (like a brisk walk)?: 0 days    On average, how many minutes do you engage in exercise at this level?: 0 min  Stress: No Stress Concern Present (07/09/2023)   Received from [facility] - Occupational Stress Questionnaire    Feeling of Stress : Not at all  Social Connections: Not on file  Intimate Partner Violence: Not At Risk (07/09/2023)   Received from [facility]   Humiliation, Afraid, Rape, and Kick questionnaire    Within the last year, have you been afraid of your partner or ex-partner?: No    Within the last year, have you been humiliated or emotionally abused in other ways by your partner or ex-partner?: No    Within the last year, have you been kicked, hit, slapped, or otherwise physically hurt by your partner or ex-partner?: No    Within the last year, have you been raped or forced to have any kind of sexual activity by your partner or ex-partner?: No    Family History  Problem Relation Age of Onset   Diabetes Mother    Heart disease Sister    Colon cancer Paternal Aunt    Heart disease Paternal Aunt    Heart disease Paternal Uncle    Skin cancer Brother    Heart disease Brother    Heart disease Other  Esophageal cancer Neg Hx    Stomach cancer Neg Hx    Inflammatory bowel disease Neg Hx    Liver disease Neg Hx    Pancreatic cancer Neg Hx    Breast cancer Neg Hx     Wt 145 lb 8 oz (66 kg)   LMP  (LMP Unknown)   BMI 26.61 kg/m   Body mass index is 26.61 kg/m.       Objective:   Physical Exam Vitals and nursing note reviewed. Exam conducted with a chaperone present.  Constitutional:      Appearance: She is well-developed.  HENT:     Head: Normocephalic and atraumatic.  Eyes:     Conjunctiva/sclera: Conjunctivae normal.     Pupils: Pupils are equal, round, and reactive to light.  Cardiovascular:     Rate and Rhythm: Normal rate and regular rhythm.  Pulmonary:     Effort: Pulmonary effort is normal.  Abdominal:     Palpations: Abdomen is soft.  Musculoskeletal:     Cervical back: Normal range of motion and neck supple.       Feet:  Skin:    General: Skin is warm and dry.  Neurological:     Mental Status: She is alert and oriented to person, place, and time.     Cranial Nerves: No cranial nerve deficit.     Motor: No abnormal muscle tone.     Coordination: Coordination normal.     Deep Tendon Reflexes: Reflexes are normal and symmetric. Reflexes normal.  Psychiatric:        Behavior: Behavior normal.        Thought Content: Thought content normal.        Judgment: Judgment normal.           Assessment & Plan:   Encounter Diagnoses  Name Primary?   Pain in left ankle and joints of left foot Yes   Nondisplaced fracture of lateral malleolus of left fibula, initial encounter for closed fracture    X-rays were done of the left ankle, reported separately.  Continue the CAM walker.  I will see her in the [facility] in two weeks.  X-rays then.  Call if any problem.  Precautions discussed.  Electronically Signed Lemond Stable, MD 7/31/20251:56 PM

## 2023-11-06 DIAGNOSIS — I10 Essential (primary) hypertension: Secondary | ICD-10-CM | POA: Diagnosis not present

## 2023-11-06 DIAGNOSIS — Z299 Encounter for prophylactic measures, unspecified: Secondary | ICD-10-CM | POA: Diagnosis not present

## 2023-11-06 DIAGNOSIS — G4700 Insomnia, unspecified: Secondary | ICD-10-CM | POA: Diagnosis not present

## 2023-11-06 DIAGNOSIS — R55 Syncope and collapse: Secondary | ICD-10-CM | POA: Diagnosis not present

## 2023-11-11 ENCOUNTER — Other Ambulatory Visit: Payer: Self-pay | Admitting: Urology

## 2023-11-11 DIAGNOSIS — N3281 Overactive bladder: Secondary | ICD-10-CM

## 2023-11-16 DIAGNOSIS — R55 Syncope and collapse: Secondary | ICD-10-CM | POA: Diagnosis not present

## 2023-11-16 DIAGNOSIS — Z299 Encounter for prophylactic measures, unspecified: Secondary | ICD-10-CM | POA: Diagnosis not present

## 2023-11-16 DIAGNOSIS — E119 Type 2 diabetes mellitus without complications: Secondary | ICD-10-CM | POA: Diagnosis not present

## 2023-11-16 DIAGNOSIS — I10 Essential (primary) hypertension: Secondary | ICD-10-CM | POA: Diagnosis not present

## 2023-11-17 ENCOUNTER — Encounter: Payer: Self-pay | Admitting: Gastroenterology

## 2023-11-17 ENCOUNTER — Ambulatory Visit: Admitting: Gastroenterology

## 2023-11-17 ENCOUNTER — Telehealth: Payer: Self-pay | Admitting: *Deleted

## 2023-11-17 VITALS — BP 131/73 | HR 105 | Temp 97.6°F | Ht 63.0 in | Wt 144.2 lb

## 2023-11-17 DIAGNOSIS — K219 Gastro-esophageal reflux disease without esophagitis: Secondary | ICD-10-CM

## 2023-11-17 DIAGNOSIS — R918 Other nonspecific abnormal finding of lung field: Secondary | ICD-10-CM

## 2023-11-17 DIAGNOSIS — K8681 Exocrine pancreatic insufficiency: Secondary | ICD-10-CM | POA: Diagnosis not present

## 2023-11-17 DIAGNOSIS — R911 Solitary pulmonary nodule: Secondary | ICD-10-CM

## 2023-11-17 DIAGNOSIS — R109 Unspecified abdominal pain: Secondary | ICD-10-CM

## 2023-11-17 DIAGNOSIS — Z860101 Personal history of adenomatous and serrated colon polyps: Secondary | ICD-10-CM | POA: Diagnosis not present

## 2023-11-17 DIAGNOSIS — K861 Other chronic pancreatitis: Secondary | ICD-10-CM

## 2023-11-17 MED ORDER — PANCRELIPASE (LIP-PROT-AMYL) 36000-114000 UNITS PO CPEP: ORAL_CAPSULE | ORAL | 11 refills | Status: AC

## 2023-11-17 NOTE — Patient Instructions (Signed)
 VISIT SUMMARY:  Today, you came in for a follow-up appointment to manage your chronic pancreatitis and discuss other health concerns. We reviewed your current medications, symptoms, and the need for updated imaging for your lung nodules and pancreas.  YOUR PLAN:  -CHRONIC PANCREATITIS WITH EXOCRINE INSUFFICIENCY: Chronic pancreatitis is a long-term inflammation of the pancreas that affects its ability to function properly. You have been managing this condition with Creon , which helps with digestion. We will send a prescription for Creon  to your pharmacy to check if your insurance covers it. If not, we will work towards finding assistance. Samples to be provided today. After completing Chest CT for lung nodules, we will work on updating your MRI pancreas (you were due for one year follow up in 09/2023).   -INTERMITTENT LEFT-SIDED ABDOMINAL PAIN: You have been experiencing occasional sharp pain on your left side, which is not consistent and lasts only a few seconds. There are no specific triggers or associated symptoms like changes in bowel movements or weight loss. We did not find any tenderness or pain during the examination. Continue to monitor symptoms. If more progressive, please let me know and we can discuss further work up.   -LUNG NODULES UNDER SURVEILLANCE: Lung nodules are small growths in the lungs that need to be monitored over time. You are overdue for a CT scan to check on these nodules (you were due in 12/2022). We will order a CT scan of your chest without contrast to evaluate them.  -GERD: continue omeprazole 40mg  daily before breakast.

## 2023-11-17 NOTE — Telephone Encounter (Signed)
 Called pt and daughter marieta answered. She is aware of appt details

## 2023-11-17 NOTE — Telephone Encounter (Signed)
 PA per cohere for CT chest Prior authorization is not required for this code.

## 2023-11-17 NOTE — Progress Notes (Signed)
 GI Office Note    Referring Provider: Maree Isles, MD Primary Care Physician:  Maree Isles, MD  Primary Gastroenterologist: Ozell Hollingshead, MD   Chief Complaint   Chief Complaint  Patient presents with   yearly check up    Yearly check up. Having pain on left groin side.      History of Present Illness   Michele Pace is a 83 y.o. female presenting today for follow-up.  Last seen in June 2024.  She has a history of chronic pancreatitis, no alcohol  exposure.  Gallbladder in situ.  No evidence of biliary etiology.  Mildly elevated calcium along the way, calcium supplements were stopped.  History of nonsolid nodule superior segment of left lower lobe of the lung incidentally found during her workup and has been continued under surveillance.  She completed MRI abdomen with and without contrast in June 2024 as outlined below, results were discussed with Dr. Wilhelmenia and he advised 1 year surveillance MRI instead of 2 year. She did not return for surveillance chest CT, was due in September 2024.  Patient provided consent for use of [facility].      She has chronic pancreatitis and lung nodules, which have been under surveillance. She has not had the scheduled imaging for her lungs since September of 2022, was due in 12/2022 . She was due for MRI pancreas in June 2025. She has difficulty obtaining her pancreatic enzyme medication, Creon , due to insurance changes. She previously received it from [facility] but now needs to obtain it from a pharmacy. She wants to resume Creon  as she felt it 'really worked' for her and made eating easier and diminished her abdominal discomfort. She has been off Creon  since 04/2023. She has had about 7-8 pound decline in weight since that time.   Earlier this year, she was diagnosed with breast cancer after a lump was found in her left breast during a mammogram. The lump was cancerous, but it was removed, and all margins were clear. She is currently on a hormone  pill for treatment, which has induced menopausal symptoms such as hot flashes. She continues to take vitamin D and E supplements due to the hormone pill's effects. She follows with oncology in [place].   She reports intermittent pain on her left side, which is not consistent and sometimes sharp. The pain occurs infrequently, possibly a couple of times a month, and is fleeting, lasting only seconds. She suspects it might be related to food intake but is unsure. No consistent aggravating factors are noted, and she reports no issues with bowel movements, no blood in stool. Her appetite has been slightly diminished due to reduced activity from wearing a boot for a leg injury. No N/V. Reflux is well controlled. No melena. No dysphagia.   She has a history of a fall resulting in a left ankle injury, for which she is currently wearing a boot. She anticipates wearing the boot for another six to eight weeks.    Wt Readings from Last 3 Encounters:  11/17/23 144 lb 3.2 oz (65.4 kg)  11/05/23 145 lb 8 oz (66 kg)  05/12/23 155 lb (70.3 kg)     Prior Data   EGD/EUS May 2022,by Dr. Wilhelmenia demonstrated compelling evidence for chronic pancreatitis without mass being seen. Multiple small PD stones. Follow-up MRI of the pancreas in 1 to 2 years given increased risk of pancreatic cancer.   MRI abdomen with and without contrast June 2024: IMPRESSION: 1. No acute  abnormality. Chronic pancreatitis. No discrete pancreatic mass. Chronic diffuse irregular dilatation of the main pancreatic duct, not substantially changed since 01/16/2020 MRI. Multiple stones within the dilated pancreatic duct in the pancreatic head up to 6 mm size. 2. No biliary ductal dilatation. 3. Bosniak category 1 and category 2 renal cysts. No suspicious renal masses. 4. Infrarenal 3.5 cm abdominal aortic aneurysm. Recommend follow-up ultrasound every 2 years. This recommendation follows ACR consensus guidelines: White Paper of the ACR  Incidental Findings Committee II on Vascular Findings. J Am Coll Radiol 2013; 10:789-794. 5. Right adrenal nodule is stable back to 2019 CT and most compatible with lipid poor adenoma, for which no follow-up imaging is recommended.   Chest CT without contrast 12/2020: IMPRESSION: 1. No acute cardiopulmonary abnormalities. 2. Unchanged appearance of scattered small solid lung nodules compatible with a benign process. 3. Non-solid nodule arising within the superior segment of left lower lobe is unchanged measuring 7 mm. Repeat CT is recommended every 2 years until 5 years of stability has been established. This recommendation follows the consensus statement: Guidelines for Management of Incidental Pulmonary Nodules Detected on CT Images: From the Fleischner Society 2017; Radiology 2017; 284:228-243. 4. Stable right adrenal gland adenoma. 5. 1.4 cm left lobe of thyroid  gland nodule is identified. Not clinically significant; no follow-up imaging recommended (ref: J Am Coll Radiol. 2015 Feb;12(2): 143-50). 6. Aortic Atherosclerosis (ICD10-I70.0).   Colonoscopy 04/2014: -diverticulosis -single colon polyp removed, tubular adenoma, consider colonoscopy in 7 years if overall health permits.  Medications   Current Outpatient Medications  Medication Sig Dispense Refill   allopurinol (ZYLOPRIM) 300 MG tablet Take 300 mg by mouth daily.      amLODipine-benazepril (LOTREL) 5-20 MG per capsule Take 1 capsule by mouth daily.      Ascorbic Acid (VITAMIN C) 1000 MG tablet Take 1,000 mg by mouth daily.      aspirin 81 MG tablet Take 81 mg by mouth daily.     b complex vitamins tablet Take 1 tablet by mouth daily.     Carboxymethylcellulose Sodium (THERATEARS) 0.25 % SOLN Place 1 drop into both eyes in the morning.     Cholecalciferol (VITAMIN D3) 125 MCG (5000 UT) TABS Take 5,000 Units by mouth daily.     Coenzyme Q10 (CO Q 10 PO) Take 200 mg by mouth daily.      Cyanocobalamin (B-12) 2500 MCG  TABS Take 2,500 mcg by mouth daily.     doxepin (SINEQUAN) 10 MG capsule Take 10 mg by mouth.     fluticasone (FLONASE) 50 MCG/ACT nasal spray Place 2 sprays into both nostrils at bedtime.      loratadine (CLARITIN) 10 MG tablet Take 10 mg by mouth daily.     metFORMIN (GLUCOPHAGE) 500 MG tablet Take 500 mg by mouth 2 (two) times daily with a meal.     omega-3 acid ethyl esters (LOVAZA) 1 G capsule Take 1 g by mouth daily.     Omega-3 Fatty Acids (FISH OIL) 1200 MG CAPS Take 1,200 mg by mouth daily.     omeprazole (PRILOSEC) 40 MG capsule Take 40 mg by mouth daily.      polycarbophil (FIBERCON) 625 MG tablet Take 625 mg by mouth daily after breakfast.     Polyethylene Glycol 3350  (CLEARLAX PO) Take 8.5 g by mouth daily.     pravastatin (PRAVACHOL) 20 MG tablet Take 20 mg by mouth at bedtime.      Probiotic Product (PROBIOTIC PO) Take 1 tablet by mouth  daily.     pyridOXINE (VITAMIN B6) 100 MG tablet Take 100 mg by mouth daily.     Vitamin E 180 MG (400 UNIT) CAPS Take 180 Units by mouth daily.     zinc gluconate 50 MG tablet Take 50 mg by mouth daily.     traMADol  (ULTRAM ) 50 MG tablet Take 1 tablet (50 mg total) by mouth every 6 (six) hours as needed for moderate pain (pain score 4-6) or severe pain (pain score 7-10). (Patient not taking: Reported on 11/17/2023) 12 tablet 0   No current facility-administered medications for this visit.    Allergies   Allergies as of 11/17/2023 - Review Complete 11/17/2023  Allergen Reaction Noted   Codeine Nausea And Vomiting    Sulfa antibiotics Nausea And Vomiting 03/09/2020          Review of Systems   General: Negative for anorexia,  fever, chills, fatigue, weakness. See hpi ENT: Negative for hoarseness, difficulty swallowing , nasal congestion. CV: Negative for chest pain, angina, palpitations, dyspnea on exertion, peripheral edema.  Respiratory: Negative for dyspnea at rest, dyspnea on exertion, cough, sputum, wheezing.  GI: See history of  present illness. GU:  Negative for dysuria, hematuria, urinary incontinence, urinary frequency, nocturnal urination.  Endo: Negative for unusual weight change.     Physical Exam   BP 131/73 (BP Location: Right Arm, Patient Position: Sitting, Cuff Size: Normal)   Pulse (!) 105   Temp 97.6 F (36.4 C) (Temporal)   Ht 5' 3 (1.6 m)   Wt 144 lb 3.2 oz (65.4 kg)   LMP  (LMP Unknown)   BMI 25.54 kg/m    General: Well-nourished, well-developed in no acute distress.  Eyes: No icterus. Mouth: Oropharyngeal mucosa moist and pink   Lungs: Clear to auscultation bilaterally.  Heart: Regular rate and rhythm, no murmurs rubs or gallops.  Abdomen: Bowel sounds are normal, nontender, nondistended, no hepatosplenomegaly or masses,  no abdominal bruits or hernia , no rebound or guarding.  Rectal: not performed Extremities: No lower extremity edema of right lower ext. No clubbing or deformities. Left lower ext in boot up to knee. Neuro: Alert and oriented x 4   Skin: Warm and dry, no jaundice.   Psych: Alert and cooperative, normal mood and affect.  Labs   Labs 08/2023: White blood cell count 8.1, hemoglobin 14.7, platelets 276,000, glucose 131, creatinine 0.72, sodium 140, calcium 10.5, albumin 4.3, total bilirubin 0.4, alk phos 85, AST 11, ALT 17, TSH 1.42.   Imaging Studies   DG Ankle Complete Left Result Date: 11/05/2023 Clinical:  fracture left ankle X-rays were done of the left ankle, three views. There is a nondisplaced fracture of the lateral malleolus of the left ankle.  Ankle mortise is normal.  Bone quality is good. Impression:  nondisplaced lateral malleolus of the left ankle. Electronically Signed Lemond Stable, MD 7/31/20251:53 PM  DG Ankle Complete Left Result Date: 10/26/2023 CLINICAL DATA:  Pain and swelling after syncope with fall EXAM: LEFT ANKLE COMPLETE - 3 VIEW; LEFT FOOT - COMPLETE 3 VIEW COMPARISON:  None Available. FINDINGS: Oblique fracture of the distal fibular diaphysis  at the level of the syndesmosis with 1 cortical width lateral displacement. No acute dislocation. No joint effusion. Ankle mortise is grossly intact. Soft tissue swelling over the lateral malleolus. IMPRESSION: Minimally displaced oblique fracture of the distal fibular diaphysis at the level of the syndesmosis (Weber type B). Electronically Signed   By: Limin  Xu M.D.   On: 10/26/2023  10:16   DG Foot Complete Left Result Date: 10/26/2023 CLINICAL DATA:  Pain and swelling after syncope with fall EXAM: LEFT ANKLE COMPLETE - 3 VIEW; LEFT FOOT - COMPLETE 3 VIEW COMPARISON:  None Available. FINDINGS: Oblique fracture of the distal fibular diaphysis at the level of the syndesmosis with 1 cortical width lateral displacement. No acute dislocation. No joint effusion. Ankle mortise is grossly intact. Soft tissue swelling over the lateral malleolus. IMPRESSION: Minimally displaced oblique fracture of the distal fibular diaphysis at the level of the syndesmosis (Weber type B). Electronically Signed   By: Limin  Xu M.D.   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 No diarrhea.  -RX for Creon  36,000 two with meals, one with snacks sent to pharmacy.  -samples of Creon  provided today -if any issues obtaining medication, she will contact us .    Intermittent left-sided abdominal pain: Intermittent, left-sided abdominal pain, occurring inconsistently without specific triggers. Pain is fleeting, lasting seconds, and occurs a couple of times a month. No associated bowel changes, nausea, vomiting. Examination reveals no tenderness or pain on palpation. May be musculoskeletal. May be related to being off Creon . -monitor symptoms, if persistent/progressive, would recommend work up. Could consider MRI pelvis at time of MRI  pancreas. Await chest CT, then make arrangements for MRI pancreas +/- pelvis if ongoing symptoms  Lung nodules under surveillance Lung nodules under surveillance, with CT scan overdue since September last year. No new respiratory symptoms reported. - Order CT scan of the chest without contrast to evaluate lung nodules  GERD: -continue omeprazole 40mg  daily before breakfast  H/O colon polyps: -single tubular adenoma removed in 2016, consider 7 year surveillance if health permitting (due in 2023). Patient not interested at this time.     Sonny RAMAN. Ezzard, MHS, PA-C [facility]

## 2023-11-19 ENCOUNTER — Encounter: Payer: Self-pay | Admitting: Orthopaedic Surgery

## 2023-11-19 ENCOUNTER — Other Ambulatory Visit (INDEPENDENT_AMBULATORY_CARE_PROVIDER_SITE_OTHER)

## 2023-11-19 ENCOUNTER — Ambulatory Visit (INDEPENDENT_AMBULATORY_CARE_PROVIDER_SITE_OTHER): Admitting: Orthopaedic Surgery

## 2023-11-19 DIAGNOSIS — S8265XA Nondisplaced fracture of lateral malleolus of left fibula, initial encounter for closed fracture: Secondary | ICD-10-CM | POA: Diagnosis not present

## 2023-11-19 DIAGNOSIS — M25572 Pain in left ankle and joints of left foot: Secondary | ICD-10-CM

## 2023-11-19 NOTE — Progress Notes (Signed)
 I feel better.  She has been using the CAM walker and the walker and doing well. She has some pain but minimal.  NV intact. ROM of left ankle is good.  X-rays were done of the left ankle, reported separately.  Encounter Diagnoses  Name Primary?   Pain in left ankle and joints of left foot Yes   Nondisplaced fracture of lateral malleolus of left fibula, subsequent encounter for closed fracture    Return in three weeks.  X-rays of the ankle on the left then.  Call if any problem.  Precautions discussed.  Electronically Signed Lemond Stable, MD 8/14/20258:36 AM

## 2023-11-27 ENCOUNTER — Encounter: Payer: Self-pay | Admitting: Radiology

## 2023-11-27 DIAGNOSIS — C50512 Malignant neoplasm of lower-outer quadrant of left female breast: Secondary | ICD-10-CM | POA: Diagnosis not present

## 2023-11-27 DIAGNOSIS — Z170 Estrogen receptor positive status [ER+]: Secondary | ICD-10-CM | POA: Diagnosis not present

## 2023-11-30 ENCOUNTER — Telehealth: Payer: Self-pay

## 2023-11-30 NOTE — Telephone Encounter (Signed)
 Patient assistance forms for Creon  are in box to be signed.

## 2023-12-01 NOTE — Telephone Encounter (Signed)
 Forms have been faxed to the company for review.

## 2023-12-01 NOTE — Telephone Encounter (Signed)
 done

## 2023-12-03 ENCOUNTER — Ambulatory Visit (HOSPITAL_COMMUNITY)
Admission: RE | Admit: 2023-12-03 | Discharge: 2023-12-03 | Disposition: A | Discharge status: Home / Self Care | Source: Ambulatory Visit | Attending: Gastroenterology | Admitting: Gastroenterology

## 2023-12-03 DIAGNOSIS — R918 Other nonspecific abnormal finding of lung field: Secondary | ICD-10-CM | POA: Diagnosis not present

## 2023-12-03 DIAGNOSIS — R911 Solitary pulmonary nodule: Secondary | ICD-10-CM | POA: Insufficient documentation

## 2023-12-03 DIAGNOSIS — K861 Other chronic pancreatitis: Secondary | ICD-10-CM | POA: Insufficient documentation

## 2023-12-03 DIAGNOSIS — Z853 Personal history of malignant neoplasm of breast: Secondary | ICD-10-CM | POA: Diagnosis not present

## 2023-12-03 DIAGNOSIS — I700 Atherosclerosis of aorta: Secondary | ICD-10-CM | POA: Diagnosis not present

## 2023-12-03 DIAGNOSIS — K219 Gastro-esophageal reflux disease without esophagitis: Secondary | ICD-10-CM | POA: Diagnosis not present

## 2023-12-10 ENCOUNTER — Encounter: Payer: Self-pay | Admitting: Orthopaedic Surgery

## 2023-12-10 ENCOUNTER — Ambulatory Visit: Admitting: Orthopaedic Surgery

## 2023-12-10 ENCOUNTER — Other Ambulatory Visit (INDEPENDENT_AMBULATORY_CARE_PROVIDER_SITE_OTHER)

## 2023-12-10 DIAGNOSIS — S8265XA Nondisplaced fracture of lateral malleolus of left fibula, initial encounter for closed fracture: Secondary | ICD-10-CM

## 2023-12-10 DIAGNOSIS — S8265XD Nondisplaced fracture of lateral malleolus of left fibula, subsequent encounter for closed fracture with routine healing: Secondary | ICD-10-CM

## 2023-12-10 NOTE — Progress Notes (Signed)
 I am better.  She is now about six weeks post fracture of the left ankle.  She has been using the CAM walker and a walker. She has no new trauma.  Left ankle has good ROM.  NV intact.  X-rays were done of the left ankle, reported separately.  Encounter Diagnosis  Name Primary?   Nondisplaced fracture of lateral malleolus of left fibula, subsequent encounter for closed fracture with routine healing Yes   I have told her to come out of the CAM walker in the house but use the CAM walker when outside the home.  Return in three weeks.  X-rays then of the left ankle.  Call if any problem.  Precautions discussed.  Electronically Signed Lemond Stable, MD 9/4/20258:58 AM

## 2023-12-14 ENCOUNTER — Other Ambulatory Visit: Payer: Self-pay | Admitting: *Deleted

## 2023-12-14 ENCOUNTER — Ambulatory Visit: Payer: Self-pay | Admitting: Gastroenterology

## 2023-12-14 ENCOUNTER — Telehealth: Payer: Self-pay

## 2023-12-14 DIAGNOSIS — R911 Solitary pulmonary nodule: Secondary | ICD-10-CM

## 2023-12-14 DIAGNOSIS — K8689 Other specified diseases of pancreas: Secondary | ICD-10-CM

## 2023-12-14 DIAGNOSIS — K861 Other chronic pancreatitis: Secondary | ICD-10-CM

## 2023-12-14 NOTE — Telephone Encounter (Signed)
 Pt's daughter Marieta left message returning your call.

## 2023-12-15 DIAGNOSIS — Z299 Encounter for prophylactic measures, unspecified: Secondary | ICD-10-CM | POA: Diagnosis not present

## 2023-12-15 DIAGNOSIS — I10 Essential (primary) hypertension: Secondary | ICD-10-CM | POA: Diagnosis not present

## 2023-12-15 DIAGNOSIS — E1169 Type 2 diabetes mellitus with other specified complication: Secondary | ICD-10-CM | POA: Diagnosis not present

## 2023-12-15 DIAGNOSIS — N390 Urinary tract infection, site not specified: Secondary | ICD-10-CM | POA: Diagnosis not present

## 2023-12-15 NOTE — Telephone Encounter (Signed)
 Pt's daughter Marieta (on dpr) informed of MRI appointment.

## 2023-12-17 ENCOUNTER — Other Ambulatory Visit: Payer: Self-pay | Admitting: Gastroenterology

## 2023-12-17 ENCOUNTER — Ambulatory Visit (HOSPITAL_COMMUNITY)
Admission: RE | Admit: 2023-12-17 | Discharge: 2023-12-17 | Disposition: A | Discharge status: Home / Self Care | Source: Ambulatory Visit | Attending: Gastroenterology | Admitting: Gastroenterology

## 2023-12-17 DIAGNOSIS — D3501 Benign neoplasm of right adrenal gland: Secondary | ICD-10-CM | POA: Diagnosis not present

## 2023-12-17 DIAGNOSIS — K861 Other chronic pancreatitis: Secondary | ICD-10-CM

## 2023-12-17 DIAGNOSIS — K838 Other specified diseases of biliary tract: Secondary | ICD-10-CM | POA: Diagnosis not present

## 2023-12-17 DIAGNOSIS — K8689 Other specified diseases of pancreas: Secondary | ICD-10-CM

## 2023-12-17 DIAGNOSIS — I714 Abdominal aortic aneurysm, without rupture, unspecified: Secondary | ICD-10-CM | POA: Diagnosis not present

## 2023-12-17 MED ORDER — GADOBUTROL 1 MMOL/ML IV SOLN
6.0000 mL | Freq: Once | INTRAVENOUS | Status: AC | PRN
  Administered 2023-12-17: 6 mL via INTRAVENOUS

## 2023-12-25 ENCOUNTER — Ambulatory Visit: Payer: Self-pay | Admitting: Gastroenterology

## 2023-12-31 ENCOUNTER — Other Ambulatory Visit (INDEPENDENT_AMBULATORY_CARE_PROVIDER_SITE_OTHER)

## 2023-12-31 ENCOUNTER — Encounter: Payer: Self-pay | Admitting: Orthopaedic Surgery

## 2023-12-31 ENCOUNTER — Ambulatory Visit: Admitting: Orthopaedic Surgery

## 2023-12-31 DIAGNOSIS — S8265XD Nondisplaced fracture of lateral malleolus of left fibula, subsequent encounter for closed fracture with routine healing: Secondary | ICD-10-CM

## 2023-12-31 NOTE — Progress Notes (Signed)
 I feel OK  She has no pain of the left ankle.  She has been using the CAM walker. She has a cane and has no problem.  X-rays were done of the left ankle today, reported separately.  ROM of the left ankle is full, no pain, NV intact.  Encounter Diagnosis  Name Primary?   Nondisplaced fracture of lateral malleolus of left fibula, subsequent encounter for closed fracture with routine healing Yes   I will discharge her today.  Resume normal shoe wear.  Call if any problem.  Precautions discussed.  Electronically Signed Lemond Stable, MD 9/25/20259:03 AM

## 2024-01-04 DIAGNOSIS — C50512 Malignant neoplasm of lower-outer quadrant of left female breast: Secondary | ICD-10-CM | POA: Diagnosis not present

## 2024-01-04 DIAGNOSIS — Z170 Estrogen receptor positive status [ER+]: Secondary | ICD-10-CM | POA: Diagnosis not present

## 2024-01-04 DIAGNOSIS — C50812 Malignant neoplasm of overlapping sites of left female breast: Secondary | ICD-10-CM | POA: Diagnosis not present

## 2024-01-05 DIAGNOSIS — E119 Type 2 diabetes mellitus without complications: Secondary | ICD-10-CM | POA: Diagnosis not present

## 2024-01-08 DIAGNOSIS — C50512 Malignant neoplasm of lower-outer quadrant of left female breast: Secondary | ICD-10-CM | POA: Diagnosis not present

## 2024-01-08 DIAGNOSIS — Z170 Estrogen receptor positive status [ER+]: Secondary | ICD-10-CM | POA: Diagnosis not present

## 2024-01-08 DIAGNOSIS — C50812 Malignant neoplasm of overlapping sites of left female breast: Secondary | ICD-10-CM | POA: Diagnosis not present

## 2024-01-18 DIAGNOSIS — I10 Essential (primary) hypertension: Secondary | ICD-10-CM | POA: Diagnosis not present

## 2024-01-18 DIAGNOSIS — Z299 Encounter for prophylactic measures, unspecified: Secondary | ICD-10-CM | POA: Diagnosis not present

## 2024-01-18 DIAGNOSIS — I739 Peripheral vascular disease, unspecified: Secondary | ICD-10-CM | POA: Diagnosis not present

## 2024-01-18 DIAGNOSIS — N390 Urinary tract infection, site not specified: Secondary | ICD-10-CM | POA: Diagnosis not present

## 2024-01-29 NOTE — Progress Notes (Addendum)
 New Patient Pulmonology Office Visit   Subjective:  Patient ID: Michele Pace, female    DOB: January 10, 1941  MRN: [id number]  Referred by: Ezzard Sonny RAMAN, PA-C  CC:  Chief Complaint  Patient presents with   Establish Care    Abn scan a few years ago Coughing up thick yellow/green mucus     HPI Michele Pace is a 83 y.o. female with PMH significant for HTN, DM, GERD who presents for initial evaluation of multiple pulmonary nodules.  Discussed the use of AI scribe software for clinical note transcription with the patient, who gave verbal consent to proceed.  History of Present Illness Michele Pace is an 83 year old female who presents for evaluation of a growing lung nodule. Approximately three years ago, a lung nodule was initially discovered during an x-ray by a gastroenterologist. Over the past three years, the nodule has increased in size from 6 mm to 12 mm. She has been coughing up a 'mucus plug' that was initially clear but turned green last year. No breathing problems, shortness of breath, or coughing, except when exerting herself or carrying heavy objects. No hemoptysis, weight changes, fevers, chills, or night sweats.  She has a history of breast cancer, diagnosed in December of the previous year, for which she underwent a lumpectomy at [facility]. She is currently under follow-up care with specialized mammograms for the next five years. No family history of lung cancer or other types of cancer.  Her past medical history includes high blood pressure, gout, and diabetes, managed with diet and exercise. She takes low-dose aspirin (81 mg) and medications for blood pressure, diabetes, and reflux. She has seasonal allergies and is allergic to codeine, which causes headaches and sickness.  She has a significant smoking history, having smoked for about 50 years, starting in her twenties and quitting ten years ago. She smoked approximately three to four cigarettes a day, increasing to half a pack  during the COVID-19 pandemic due to boredom. Her husband, who smoked, passed away in 03/13/1984. No current exposure to smoke or use of a wood-burning stove or furnace.  She recently fell and broke her tibia in July, for which she wore a boot and now uses a cane due to balance issues. She lives in [place] and spends her days reading and exercising.  Symptoms Associated with Lung cancer:   Central Tumor Sx: Cough  Peripheral Tumor Sx: Cough  Sx of Metastasis: none  Hx of anesthesia reactions: none  PMH: as above  Important Medications: baby aspirin  Allergies: Sulfa  Social History:  Smoking and [employer] Exposure: Tobacco Smoking 25 PY  Family History: no cancer hx  ASA grade:  ASA 2 - Patient with mild systemic disease with no functional limitations  Karnofsky Performance Status: 90 Able to carry on normal activity, minor signs, or symptoms of disease  ECOG Performance Status: (1) Restricted in physically strenuous activity, ambulatory and able to do work of light nature  ROS  Allergies: Codeine and Sulfa antibiotics  Current Outpatient Medications:    alfuzosin  (UROXATRAL ) 10 MG 24 hr tablet, take 1 tablet (10 MILLIGRAM total) by mouth daily with breakfast., Disp: 30 tablet, Rfl: 0   allopurinol (ZYLOPRIM) 300 MG tablet, Take 300 mg by mouth daily. , Disp: , Rfl:    amLODipine-benazepril (LOTREL) 5-20 MG per capsule, Take 1 capsule by mouth daily. , Disp: , Rfl:    b complex vitamins tablet, Take 1 tablet by mouth daily., Disp: ,  Rfl:    Carboxymethylcellulose Sodium (THERATEARS) 0.25 % SOLN, Place 1 drop into both eyes in the morning., Disp: , Rfl:    Cholecalciferol (VITAMIN D3) 125 MCG (5000 UT) TABS, Take 5,000 Units by mouth daily., Disp: , Rfl:    Coenzyme Q10 (CO Q 10 PO), Take 200 mg by mouth daily. , Disp: , Rfl:    Cyanocobalamin (B-12) 2500 MCG TABS, Take 2,500 mcg by mouth daily., Disp: , Rfl:    doxepin (SINEQUAN) 10 MG capsule, Take 10 mg by mouth., Disp: ,  Rfl:    fluticasone (FLONASE) 50 MCG/ACT nasal spray, Place 2 sprays into both nostrils at bedtime. , Disp: , Rfl:    lipase/protease/amylase (CREON ) 36000 UNITS CPEP capsule, Take 2 capsules (72,000 Units total) by mouth 3 (three) times daily with meals. May also take 1 capsule (36,000 Units total) as needed (with snacks - up to 4 snacks daily)., Disp: 300 capsule, Rfl: 11   loratadine (CLARITIN) 10 MG tablet, Take 10 mg by mouth daily., Disp: , Rfl:    metFORMIN (GLUCOPHAGE) 500 MG tablet, Take 500 mg by mouth 2 (two) times daily with a meal., Disp: , Rfl:    omega-3 acid ethyl esters (LOVAZA) 1 G capsule, Take 1 g by mouth daily., Disp: , Rfl:    Omega-3 Fatty Acids (FISH OIL) 1200 MG CAPS, Take 1,200 mg by mouth daily., Disp: , Rfl:    omeprazole (PRILOSEC) 40 MG capsule, Take 40 mg by mouth daily. , Disp: , Rfl:    polycarbophil (FIBERCON) 625 MG tablet, Take 625 mg by mouth daily after breakfast., Disp: , Rfl:    Polyethylene Glycol 3350  (CLEARLAX PO), Take 8.5 g by mouth daily., Disp: , Rfl:    pravastatin (PRAVACHOL) 20 MG tablet, Take 20 mg by mouth at bedtime. , Disp: , Rfl:    Probiotic Product (PROBIOTIC PO), Take 1 tablet by mouth daily., Disp: , Rfl:    pyridOXINE (VITAMIN B6) 100 MG tablet, Take 100 mg by mouth daily., Disp: , Rfl:    traZODone (DESYREL) 50 MG tablet, Take 50 mg by mouth at bedtime., Disp: , Rfl:    Vitamin E 180 MG (400 UNIT) CAPS, Take 180 Units by mouth daily., Disp: , Rfl:    zinc gluconate 50 MG tablet, Take 50 mg by mouth daily., Disp: , Rfl:    Ascorbic Acid (VITAMIN C) 1000 MG tablet, Take 1,000 mg by mouth daily.  (Patient not taking: Reported on 02/01/2024), Disp: , Rfl:    aspirin 81 MG tablet, Take 81 mg by mouth daily. (Patient not taking: Reported on 02/01/2024), Disp: , Rfl:    traMADol  (ULTRAM ) 50 MG tablet, Take 1 tablet (50 mg total) by mouth every 6 (six) hours as needed for moderate pain (pain score 4-6) or severe pain (pain score 7-10). (Patient  not taking: Reported on 02/01/2024), Disp: 12 tablet, Rfl: 0 Past Medical History:  Diagnosis Date   Cancer (HCC)    Left Breast Cancer - lower-outer quadrant of left breast   Diabetes (HCC)    type 2   GERD (gastroesophageal reflux disease)    Gout    no current problem   Headache    no current problem   HTN (hypertension)    Hx of adenomatous colonic polyps    Hypercholesteremia    Seasonal allergies    Past Surgical History:  Procedure Laterality Date   ABDOMINAL HYSTERECTOMY     BIOPSY  08/09/2020   Procedure: BIOPSY;  Surgeon:  Mansouraty, Aloha Raddle., MD;  Location: [facility] ENDOSCOPY;  Service: Gastroenterology;;   BREAST BIOPSY Left 04/03/2023   US  LT BREAST BX W LOC DEV 1ST LESION IMG BX SPEC US  GUIDE 04/03/2023 [facility] MAMMOGRAPHY   BREAST BIOPSY  05/11/2023   MM LT RADIOACTIVE SEED LOC MAMMO GUIDE 05/11/2023 [facility] MAMMOGRAPHY   BREAST LUMPECTOMY WITH RADIOACTIVE SEED LOCALIZATION Left 05/12/2023   Procedure: LEFT BREAST LUMPECTOMY WITH RADIOACTIVE SEED LOCALIZATION;  Surgeon: Aron Shoulders, MD;  Location: [facility] OR;  Service: General;  Laterality: Left;   CATARACT EXTRACTION W/PHACO Left 06/14/2018   Procedure: CATARACT EXTRACTION PHACO AND INTRAOCULAR LENS PLACEMENT (IOC);  Surgeon: Harrie Agent, MD;  Location: [facility] ORS;  Service: Ophthalmology;  Laterality: Left;  CDE: 4.13   CATARACT EXTRACTION W/PHACO Right 03/19/2020   Procedure: CATARACT EXTRACTION PHACO AND INTRAOCULAR LENS PLACEMENT (IOC);  Surgeon: Harrie Agent, MD;  Location: [facility];  Service: Ophthalmology;  Laterality: Right;  CDE 10.01   COLONOSCOPY  10/25/07   RMR: normal rectum left sided diverticula   COLONOSCOPY N/A 04/26/2014   Dr. Shaaron: There are sigmoid diverticula, 3 mm polyp removed from the descending segment, tubular adenoma.  Next colonoscopy in 7 years   ESOPHAGOGASTRODUODENOSCOPY  10/25/07   MFM:ejulonld esophagogasteic junction focal antral erosions and bular erosions, otherwise normal. mild chronic gastritis.     ESOPHAGOGASTRODUODENOSCOPY (EGD) WITH PROPOFOL  N/A 08/09/2020   Procedure: ESOPHAGOGASTRODUODENOSCOPY (EGD) WITH PROPOFOL ;  Surgeon: Wilhelmenia Aloha Raddle., MD;  Location: [facility] ENDOSCOPY;  Service: Gastroenterology;  Laterality: N/A;   EUS N/A 08/09/2020   Procedure: UPPER ENDOSCOPIC ULTRASOUND (EUS) RADIAL;  Surgeon: Wilhelmenia Aloha Raddle., MD;  Location: [facility] ENDOSCOPY;  Service: Gastroenterology;  Laterality: N/A;   PARATHYROIDECTOMY  1999/2000   removed two   Family History  Problem Relation Age of Onset   Diabetes Mother    Heart disease Sister    Colon cancer Paternal Aunt    Heart disease Paternal Aunt    Heart disease Paternal Uncle    Skin cancer Brother    Heart disease Brother    Heart disease Other    Esophageal cancer Neg Hx    Stomach cancer Neg Hx    Inflammatory bowel disease Neg Hx    Liver disease Neg Hx    Pancreatic cancer Neg Hx    Breast cancer Neg Hx    Social History   Socioeconomic History   Marital status: Widowed    Spouse name: Not on file   Number of children: Not on file   Years of education: Not on file   Highest education level: Not on file  Occupational History   Occupation: retired runner, broadcasting/film/video  Tobacco Use   Smoking status: Former    Current packs/day: 0.25    Average packs/day: 0.3 packs/day for 40.0 years (10.0 ttl pk-yrs)    Types: Cigarettes   Smokeless tobacco: Never   Tobacco comments:    Quit smoking 10 yrs ago per patient on 05/08/23.  Vaping Use   Vaping status: Never Used  Substance and Sexual Activity   Alcohol  use: No    Alcohol /week: 0.0 standard drinks of alcohol    Drug use: No   Sexual activity: Not Currently    Birth control/protection: Surgical    Comment: Hysterectomy  Other Topics Concern   Not on file  Social History Narrative   Adopted daughter   Social Drivers of Health   Financial Resource Strain: Low Risk  (07/09/2023)   Received from [facility]   Overall Financial Resource Strain (CARDIA)  Difficulty  of Paying Living Expenses: Not hard at all  Food Insecurity: No Food Insecurity (01/08/2024)   Received from [facility]   Hunger Vital Sign    Within the past 12 months, you worried that your food would run out before you got the money to buy more.: Never true    Within the past 12 months, the food you bought just didn't last and you didn't have money to get more.: Never true  Transportation Needs: No Transportation Needs (01/08/2024)   Received from [facility] - Transportation    Lack of Transportation (Medical): No    Lack of Transportation (Non-Medical): No  Physical Activity: Inactive (07/09/2023)   Received from [facility]   Exercise Vital Sign    On average, how many days per week do you engage in moderate to strenuous exercise (like a brisk walk)?: 0 days    On average, how many minutes do you engage in exercise at this level?: 0 min  Stress: No Stress Concern Present (07/09/2023)   Received from [facility] - Occupational Stress Questionnaire    Feeling of Stress : Not at all  Social Connections: Not on file  Intimate Partner Violence: Not At Risk (01/08/2024)   Received from [facility]   Humiliation, Afraid, Rape, and Kick questionnaire    Within the last year, have you been afraid of your partner or ex-partner?: No    Within the last year, have you been humiliated or emotionally abused in other ways by your partner or ex-partner?: No    Within the last year, have you been kicked, hit, slapped, or otherwise physically hurt by your partner or ex-partner?: No    Within the last year, have you been raped or forced to have any kind of sexual activity by your partner or ex-partner?: No       Objective:  BP 123/76   Pulse 98   Ht 5' 3 (1.6 m)   Wt 140 lb 9.6 oz (63.8 kg)   LMP  (LMP Unknown)   SpO2 96% Comment: ra  BMI 24.91 kg/m  Wt Readings from Last 3 Encounters:  02/01/24 140 lb 9.6 oz (63.8 kg)   11/17/23 144 lb 3.2 oz (65.4 kg)  11/05/23 145 lb 8 oz (66 kg)   BMI Readings from Last 3 Encounters:  02/01/24 24.91 kg/m  11/17/23 25.54 kg/m  11/05/23 26.61 kg/m   SpO2 Readings from Last 3 Encounters:  02/01/24 96%  10/26/23 96%  05/12/23 97%   Physical Exam General: NAD, alert, WD, WN Eyes: PERRL, no scleral icterus ENMT: oropharynx clear, good dentition, no oral lesions, mallampati score I Skin: warm, intact, no rashes Neck: JVD flat, ROM and lymph node assessment normal CV: RRR, no MRG, nl S1 and S2, no peripheral edema Resp: clear to auscultation bilaterally, no wheezes, rales, or rhonchi, normal effort, no clubbing/cyanosis Neuro: Awake alert oriented to person place time and situation  Diagnostic Review:  Last CBC Lab Results  Component Value Date   WBC 10.1 05/08/2023   HGB 13.6 05/08/2023   HCT 40.6 05/08/2023   MCV 95.3 05/08/2023   MCH 31.9 05/08/2023   RDW 13.0 05/08/2023   PLT 251 05/08/2023   Last metabolic panel Lab Results  Component Value Date   GLUCOSE 140 (H) 05/08/2023   NA 138 05/08/2023   K 3.9 05/08/2023   CL 102 05/08/2023   CO2 25 05/08/2023  BUN 16 05/08/2023   CREATININE 0.75 05/08/2023   GFRNONAA >60 05/08/2023   CALCIUM 10.2 05/08/2023   PROT 7.2 12/07/2019   BILITOT 0.5 12/07/2019   AST 16 12/07/2019   ALT 22 12/07/2019   ANIONGAP 11 05/08/2023    I reviewed chest imaging since 12/2020 onwards. The patient has a left lower lobe ground glass nodule that measured around 7 mm in 2022 and now is 13 x 8 mm.  CT Chest 12/03/23: IMPRESSION: 1. Left upper lobe ground-glass pulmonary nodule, measuring 13 x 8 mm, slightly increased in size since prior study. Follow-up CT is recommended every 2 years until 5 years of stability has been established. This recommendation follows the consensus statement: Guidelines for Management of Incidental Pulmonary Nodules Detected on CT Images: From the Fleischner Society 2017; Radiology  2017; 284:228-243. 2. Multiple subcentimeter solid pulmonary nodules, unchanged since prior studies, most consistent with benign nodules given size and long-term stability. 3. Aortic Atherosclerosis (ICD10-I70.0). Coronary artery atherosclerosis.    Assessment & Plan:   Assessment & Plan Solitary pulmonary nodule  Assessment and Plan Assessment & Plan Enlarging left pulmonary nodule Left pulmonary nodule increased from 11 x 7 mm to 13 x 8 mm over three years, raising concern for early malignancy. This occurred prior to new diagnosis of breast cancer and likely unrelated. Differential includes early-stage lung cancer or fungal infection. Smoking increase lung cancer risk. Green mucus suggests possible infection. Discussed biopsy versus serial imaging. The recommendation for ground glass nodules is to follow with serial imaging until the nodule size > 3 cm or it develops a solid component. Therefore, will repeat CT chest in a year and then see patient in follow up. - CT chest super D around 8 or 12/2024 - Will obtain PFTs to assess baseline lung function which will help with treatment planning if patient has early lung cancer.  Orders Placed This Encounter  Procedures   Procedural/ Surgical Case Request: VIDEO BRONCHOSCOPY WITH ENDOBRONCHIAL NAVIGATION   CT SUPER D CHEST WO CONTRAST   Pulmonary function test   I spent 45 minutes reviewing patient's chart including prior consultant notes, imaging, and PFTs as well as face-to-face with the patient, over half in discussion of the diagnosis and the importance of compliance with the treatment plan.  Return in about 4 weeks (around 02/29/2024).   Dayanna Pryce, MD

## 2024-02-01 ENCOUNTER — Telehealth: Payer: Self-pay | Admitting: Pulmonary Disease

## 2024-02-01 ENCOUNTER — Ambulatory Visit: Admitting: Pulmonary Disease

## 2024-02-01 ENCOUNTER — Encounter: Payer: Self-pay | Admitting: Pulmonary Disease

## 2024-02-01 VITALS — BP 123/76 | HR 98 | Ht 63.0 in | Wt 140.6 lb

## 2024-02-01 DIAGNOSIS — R911 Solitary pulmonary nodule: Secondary | ICD-10-CM | POA: Insufficient documentation

## 2024-02-01 NOTE — Telephone Encounter (Signed)
 Please schedule the following:  Provider performing procedure:Challen Spainhour/Hattar/Dewald Diagnosis: LUNG NODULE Which side if for nodule / mass? LEFT Procedure: NAVIGATIONAL BRONCH (ION)  Has patient been spoken to by Provider and given informed consent? YES Anesthesia: GENERAL Do you need Fluro? YES Duration of procedure: 1.5 HOURS Date: 02/11/2024 Alternate Date: 02/15/2024  Time: ANY Location: [facility] ENDO Does patient have OSA? No DM? yes Or Latex allergy? no Medication Restriction/ Anticoagulate/Antiplatelet: Aspirin 81 mg, stop two days prior to procedure Pre-op Labs Ordered:determined by Anesthesia Imaging request: Super D CT Chest prior to procedure  (If, SuperDimension CT Chest, please have STAT courier sent to ENDO)

## 2024-02-01 NOTE — Telephone Encounter (Signed)
 Letter given by Lucie Nao will forward to [facility] to get auth

## 2024-02-01 NOTE — Patient Instructions (Addendum)
  VISIT SUMMARY: Today, we evaluated a growing lung nodule that has increased in size over the past three years. Given your history of breast cancer and smoking, we discussed the possibility of a biopsy to determine the nature of the nodule.  YOUR PLAN: ENLARGING LEFT PULMONARY NODULE: The nodule in your left lung has grown from 6 mm to 12 mm over the past three years, which raises concerns about potential malignancy or infection. -We will schedule a biopsy of the left pulmonary nodule to determine its nature. -Stop taking aspirin two days before the biopsy. -Fast from midnight before the procedure. -You may take your blood pressure and diabetes medications with a small sip of water  on the morning of the procedure. -We will arrange a super DCT scan to help plan the biopsy. -The biopsy will be coordinated at [facility] in [place].  Please follow up in a week after biopsy is completed                       Contains text generated by Abridge.                                 Contains text generated by Abridge.

## 2024-02-03 ENCOUNTER — Telehealth: Payer: Self-pay | Admitting: Pulmonary Disease

## 2024-02-03 NOTE — Telephone Encounter (Signed)
 LVM for patient to call and verify time of PFT at [facility] on 04/12/24

## 2024-02-04 ENCOUNTER — Ambulatory Visit (HOSPITAL_COMMUNITY)
Admission: RE | Admit: 2024-02-04 | Discharge: 2024-02-04 | Disposition: A | Discharge status: Home / Self Care | Source: Ambulatory Visit | Attending: Pulmonary Disease | Admitting: Pulmonary Disease

## 2024-02-04 DIAGNOSIS — R911 Solitary pulmonary nodule: Secondary | ICD-10-CM | POA: Insufficient documentation

## 2024-02-04 DIAGNOSIS — I700 Atherosclerosis of aorta: Secondary | ICD-10-CM | POA: Diagnosis not present

## 2024-02-04 DIAGNOSIS — R918 Other nonspecific abnormal finding of lung field: Secondary | ICD-10-CM | POA: Diagnosis not present

## 2024-02-04 DIAGNOSIS — E041 Nontoxic single thyroid nodule: Secondary | ICD-10-CM | POA: Diagnosis not present

## 2024-02-08 ENCOUNTER — Encounter: Payer: Self-pay | Admitting: Radiology

## 2024-02-08 ENCOUNTER — Ambulatory Visit: Payer: Self-pay | Admitting: Pulmonary Disease

## 2024-02-08 DIAGNOSIS — Z299 Encounter for prophylactic measures, unspecified: Secondary | ICD-10-CM | POA: Diagnosis not present

## 2024-02-08 DIAGNOSIS — I7143 Infrarenal abdominal aortic aneurysm, without rupture: Secondary | ICD-10-CM | POA: Diagnosis not present

## 2024-02-08 DIAGNOSIS — Z0000 Encounter for general adult medical examination without abnormal findings: Secondary | ICD-10-CM | POA: Diagnosis not present

## 2024-02-08 DIAGNOSIS — E1169 Type 2 diabetes mellitus with other specified complication: Secondary | ICD-10-CM | POA: Diagnosis not present

## 2024-02-08 DIAGNOSIS — I10 Essential (primary) hypertension: Secondary | ICD-10-CM | POA: Diagnosis not present

## 2024-02-08 NOTE — Addendum Note (Signed)
 Addended by: Alaycia Eardley on: 02/08/2024 08:20 AM   Modules accepted: Orders

## 2024-02-08 NOTE — Progress Notes (Signed)
 Noted - it has been routed to rds admin

## 2024-02-09 DIAGNOSIS — E1129 Type 2 diabetes mellitus with other diabetic kidney complication: Secondary | ICD-10-CM | POA: Diagnosis not present

## 2024-02-09 NOTE — Telephone Encounter (Signed)
 Information / instructions mailed to patient

## 2024-02-11 ENCOUNTER — Encounter (HOSPITAL_COMMUNITY): Payer: Self-pay

## 2024-02-11 ENCOUNTER — Ambulatory Visit (HOSPITAL_COMMUNITY): Admit: 2024-02-11 | Admitting: Pulmonary Disease

## 2024-02-11 DIAGNOSIS — R911 Solitary pulmonary nodule: Secondary | ICD-10-CM | POA: Insufficient documentation

## 2024-02-11 SURGERY — VIDEO BRONCHOSCOPY WITH ENDOBRONCHIAL NAVIGATION
Anesthesia: General | Laterality: Left

## 2024-02-16 ENCOUNTER — Ambulatory Visit: Admitting: Acute Care

## 2024-02-17 ENCOUNTER — Encounter (HOSPITAL_COMMUNITY): Payer: Self-pay | Admitting: Internal Medicine

## 2024-02-18 DIAGNOSIS — N390 Urinary tract infection, site not specified: Secondary | ICD-10-CM | POA: Diagnosis not present

## 2024-02-18 DIAGNOSIS — R809 Proteinuria, unspecified: Secondary | ICD-10-CM | POA: Diagnosis not present

## 2024-02-18 DIAGNOSIS — Z299 Encounter for prophylactic measures, unspecified: Secondary | ICD-10-CM | POA: Diagnosis not present

## 2024-02-18 DIAGNOSIS — E1129 Type 2 diabetes mellitus with other diabetic kidney complication: Secondary | ICD-10-CM | POA: Diagnosis not present

## 2024-02-26 DIAGNOSIS — Z299 Encounter for prophylactic measures, unspecified: Secondary | ICD-10-CM | POA: Diagnosis not present

## 2024-02-26 DIAGNOSIS — I10 Essential (primary) hypertension: Secondary | ICD-10-CM | POA: Diagnosis not present

## 2024-02-26 DIAGNOSIS — E119 Type 2 diabetes mellitus without complications: Secondary | ICD-10-CM | POA: Diagnosis not present

## 2024-02-26 DIAGNOSIS — I7143 Infrarenal abdominal aortic aneurysm, without rupture: Secondary | ICD-10-CM | POA: Diagnosis not present

## 2024-02-26 DIAGNOSIS — N390 Urinary tract infection, site not specified: Secondary | ICD-10-CM | POA: Diagnosis not present

## 2024-02-29 ENCOUNTER — Encounter (HOSPITAL_COMMUNITY): Payer: Self-pay | Admitting: Internal Medicine

## 2024-03-02 ENCOUNTER — Other Ambulatory Visit (HOSPITAL_COMMUNITY): Payer: Self-pay | Admitting: Internal Medicine

## 2024-03-02 DIAGNOSIS — I7143 Infrarenal abdominal aortic aneurysm, without rupture: Secondary | ICD-10-CM

## 2024-03-18 ENCOUNTER — Ambulatory Visit (HOSPITAL_COMMUNITY): Admission: RE | Admit: 2024-03-18 | Discharge: 2024-03-18 | Attending: Internal Medicine | Admitting: Internal Medicine

## 2024-03-18 DIAGNOSIS — I7143 Infrarenal abdominal aortic aneurysm, without rupture: Secondary | ICD-10-CM

## 2024-04-19 ENCOUNTER — Ambulatory Visit (HOSPITAL_COMMUNITY)
Admission: RE | Admit: 2024-04-19 | Discharge: 2024-04-19 | Disposition: A | Discharge status: Home / Self Care | Source: Ambulatory Visit | Attending: Pulmonary Disease | Admitting: Pulmonary Disease

## 2024-04-19 DIAGNOSIS — R0609 Other forms of dyspnea: Secondary | ICD-10-CM | POA: Diagnosis not present

## 2024-04-19 DIAGNOSIS — R911 Solitary pulmonary nodule: Secondary | ICD-10-CM | POA: Diagnosis present

## 2024-04-19 DIAGNOSIS — J4489 Other specified chronic obstructive pulmonary disease: Secondary | ICD-10-CM | POA: Insufficient documentation

## 2024-04-19 DIAGNOSIS — Z87891 Personal history of nicotine dependence: Secondary | ICD-10-CM | POA: Diagnosis not present

## 2024-04-19 LAB — PULMONARY FUNCTION TEST
DL/VA % pred: 92 %
DL/VA: 3.82 ml/min/mmHg/L
DLCO unc % pred: 82 %
DLCO unc: 14.71 ml/min/mmHg
FEF 25-75 Pre: 1.16 L/s
FEF2575-%Pred-Pre: 89 %
FEV1-%Pred-Pre: 97 %
FEV1-Pre: 1.77 L
FEV1FVC-%Pred-Pre: 97 %
FEV6-%Pred-Pre: 104 %
FEV6-Pre: 2.41 L
FEV6FVC-%Pred-Pre: 103 %
FVC-%Pred-Pre: 100 %
FVC-Pre: 2.46 L
Pre FEV1/FVC ratio: 72 %
Pre FEV6/FVC Ratio: 98 %
RV % pred: 96 %
RV: 2.29 L
TLC % pred: 95 %
TLC: 4.66 L

## 2024-07-14 ENCOUNTER — Ambulatory Visit: Admitting: Diagnostic Neuroimaging

## 2024-12-02 ENCOUNTER — Other Ambulatory Visit (HOSPITAL_COMMUNITY)
